# Patient Record
Sex: Male | Born: 1950 | Race: Black or African American | Hispanic: No | Marital: Married | Smoking: Current every day smoker
Health system: Southern US, Community
[De-identification: ages and names within clinical notes are randomized; demographics above are authoritative.]

## PROBLEM LIST (undated history)

## (undated) DIAGNOSIS — I10 Essential (primary) hypertension: Secondary | ICD-10-CM

## (undated) DIAGNOSIS — M1990 Unspecified osteoarthritis, unspecified site: Secondary | ICD-10-CM

## (undated) DIAGNOSIS — J449 Chronic obstructive pulmonary disease, unspecified: Secondary | ICD-10-CM

## (undated) HISTORY — PX: TONSILLECTOMY: SUR1361

---

## 2016-07-09 ENCOUNTER — Emergency Department (HOSPITAL_COMMUNITY): Payer: Non-veteran care

## 2016-07-09 ENCOUNTER — Emergency Department (HOSPITAL_COMMUNITY)
Admission: EM | Admit: 2016-07-09 | Discharge: 2016-07-09 | Disposition: A | Discharge status: Home / Self Care | Payer: Non-veteran care | Attending: Emergency Medicine | Admitting: Emergency Medicine

## 2016-07-09 ENCOUNTER — Encounter (HOSPITAL_COMMUNITY): Payer: Self-pay | Admitting: Vascular Surgery

## 2016-07-09 DIAGNOSIS — F17210 Nicotine dependence, cigarettes, uncomplicated: Secondary | ICD-10-CM | POA: Insufficient documentation

## 2016-07-09 DIAGNOSIS — I10 Essential (primary) hypertension: Secondary | ICD-10-CM | POA: Insufficient documentation

## 2016-07-09 DIAGNOSIS — J441 Chronic obstructive pulmonary disease with (acute) exacerbation: Secondary | ICD-10-CM | POA: Insufficient documentation

## 2016-07-09 DIAGNOSIS — R0602 Shortness of breath: Secondary | ICD-10-CM | POA: Diagnosis present

## 2016-07-09 HISTORY — DX: Unspecified osteoarthritis, unspecified site: M19.90

## 2016-07-09 HISTORY — DX: Essential (primary) hypertension: I10

## 2016-07-09 LAB — BASIC METABOLIC PANEL
Anion gap: 5 (ref 5–15)
BUN: 23 mg/dL — ABNORMAL HIGH (ref 6–20)
CO2: 28 mmol/L (ref 22–32)
Calcium: 9 mg/dL (ref 8.9–10.3)
Chloride: 101 mmol/L (ref 101–111)
Creatinine, Ser: 1.22 mg/dL (ref 0.61–1.24)
GFR calc Af Amer: >60 mL/min (ref >60)
GFR calc non Af Amer: >60 mL/min (ref >60)
Glucose, Bld: 115 mg/dL — ABNORMAL HIGH (ref 65–99)
Potassium: 3.8 mmol/L (ref 3.5–5.1)
Sodium: 134 mmol/L — ABNORMAL LOW (ref 135–145)

## 2016-07-09 LAB — CBC
HCT: 46.3 % (ref 39.0–52.0)
Hemoglobin: 15.9 g/dL (ref 13.0–17.0)
MCH: 34.8 pg — ABNORMAL HIGH (ref 26.0–34.0)
MCHC: 34.3 g/dL (ref 30.0–36.0)
MCV: 101.3 fL — ABNORMAL HIGH (ref 78.0–100.0)
Platelets: 173 10*3/uL (ref 150–400)
RBC: 4.57 MIL/uL (ref 4.22–5.81)
RDW: 14.5 % (ref 11.5–15.5)
WBC: 8.5 10*3/uL (ref 4.0–10.5)

## 2016-07-09 LAB — I-STAT TROPONIN, ED
TROPONIN I, POC: 0.02 ng/mL (ref 0.00–0.08)
TROPONIN I, POC: 0.02 ng/mL (ref 0.00–0.08)

## 2016-07-09 MED ORDER — ALBUTEROL SULFATE HFA 108 (90 BASE) MCG/ACT IN AERS
2.0000 | INHALATION_SPRAY | Freq: Once | RESPIRATORY_TRACT | Status: AC
  Administered 2016-07-09: 2 via RESPIRATORY_TRACT
  Filled 2016-07-09: qty 6.7

## 2016-07-09 MED ORDER — ALBUTEROL SULFATE HFA 108 (90 BASE) MCG/ACT IN AERS: 1.0000 | INHALATION_SPRAY | Freq: Four times a day (QID) | RESPIRATORY_TRACT | 0 refills | Status: AC | PRN

## 2016-07-09 MED ORDER — PREDNISONE 20 MG PO TABS
60.0000 mg | ORAL_TABLET | Freq: Once | ORAL | Status: AC
  Administered 2016-07-09: 60 mg via ORAL
  Filled 2016-07-09: qty 3

## 2016-07-09 MED ORDER — AZITHROMYCIN 500 MG PO TABS: 500.0000 mg | ORAL_TABLET | Freq: Every day | ORAL | 0 refills | Status: AC

## 2016-07-09 MED ORDER — AZITHROMYCIN 250 MG PO TABS
500.0000 mg | ORAL_TABLET | Freq: Once | ORAL | Status: AC
  Administered 2016-07-09: 500 mg via ORAL
  Filled 2016-07-09: qty 2

## 2016-07-09 MED ORDER — IPRATROPIUM-ALBUTEROL 0.5-2.5 (3) MG/3ML IN SOLN
3.0000 mL | Freq: Once | RESPIRATORY_TRACT | Status: AC
  Administered 2016-07-09: 3 mL via RESPIRATORY_TRACT
  Filled 2016-07-09: qty 3

## 2016-07-09 MED ORDER — PREDNISONE 20 MG PO TABS: 60.0000 mg | ORAL_TABLET | Freq: Every day | ORAL | 0 refills | Status: AC

## 2016-07-09 NOTE — ED Triage Notes (Signed)
Pt reports to the ED for eval of productive yellow cough and SOB x several months. SOB is constant. Pt reports the symptoms have stayed the same but they are not resolving so he decided to come in. Reports some intermittent CPs but he can take some Tums and usually that will help. Pt denies any CP at this time.

## 2016-07-09 NOTE — Discharge Instructions (Signed)
We believe that your symptoms are caused today by an exacerbation of your COPD, and possibly bronchitis.  Please take the prescribed medications and any medications that you have at home for your COPD.  Follow up with your doctor as recommended.  If you develop any new or worsening symptoms, including but not limited to fever, persistent vomiting, worsening shortness of breath, or other symptoms that concern you, please return to the Emergency Department immediately. ° ° °Chronic Obstructive Pulmonary Disease °Chronic obstructive pulmonary disease (COPD) is a common lung condition in which airflow from the lungs is limited. COPD is a general term that can be used to describe many different lung problems that limit airflow, including both chronic bronchitis and emphysema.  If you have COPD, your lung function will probably never return to normal, but there are measures you can take to improve lung function and make yourself feel better.  °CAUSES  °Smoking (common).   °Exposure to secondhand smoke.   °Genetic problems. °Chronic inflammatory lung diseases or recurrent infections. °SYMPTOMS  °Shortness of breath, especially with physical activity.   °Deep, persistent (chronic) cough with a large amount of thick mucus.   °Wheezing.   °Rapid breaths (tachypnea).   °Gray or bluish discoloration (cyanosis) of the skin, especially in fingers, toes, or lips.   °Fatigue.   °Weight loss.   °Frequent infections or episodes when breathing symptoms become much worse (exacerbations).   °Chest tightness. °DIAGNOSIS  °Your health care provider will take a medical history and perform a physical examination to make the initial diagnosis.  Additional tests for COPD may include:  °Lung (pulmonary) function tests. °Chest X-ray. °CT scan. °Blood tests. °TREATMENT  °Treatment available to help you feel better when you have COPD includes:  °Inhaler and nebulizer medicines. These help manage the symptoms of COPD and make your breathing more  comfortable. °Supplemental oxygen. Supplemental oxygen is only helpful if you have a low oxygen level in your blood.   °Exercise and physical activity. These are beneficial for nearly all people with COPD. Some people may also benefit from a pulmonary rehabilitation program. °HOME CARE INSTRUCTIONS  °Take all medicines (inhaled or pills) as directed by your health care provider. °Avoid over-the-counter medicines or cough syrups that dry up your airway (such as antihistamines) and slow down the elimination of secretions unless instructed otherwise by your health care provider.   °If you are a smoker, the most important thing that you can do is stop smoking. Continuing to smoke will cause further lung damage and breathing trouble. Ask your health care provider for help with quitting smoking. He or she can direct you to community resources or hospitals that provide support. °Avoid exposure to irritants such as smoke, chemicals, and fumes that aggravate your breathing. °Use oxygen therapy and pulmonary rehabilitation if directed by your health care provider. If you require home oxygen therapy, ask your health care provider whether you should purchase a pulse oximeter to measure your oxygen level at home.   °Avoid contact with individuals who have a contagious illness. °Avoid extreme temperature and humidity changes. °Eat healthy foods. Eating smaller, more frequent meals and resting before meals may help you maintain your strength. °Stay active, but balance activity with periods of rest. Exercise and physical activity will help you maintain your ability to do things you want to do. °Preventing infection and hospitalization is very important when you have COPD. Make sure to receive all the vaccines your health care provider recommends, especially the pneumococcal and influenza   vaccines. Ask your health care provider whether you need a pneumonia vaccine. °Learn and use relaxation techniques to manage stress. °Learn and  use controlled breathing techniques as directed by your health care provider. Controlled breathing techniques include:   °Pursed lip breathing. Start by breathing in (inhaling) through your nose for 1 second. Then, purse your lips as if you were going to whistle and breathe out (exhale) through the pursed lips for 2 seconds.   °Diaphragmatic breathing. Start by putting one hand on your abdomen just above your waist. Inhale slowly through your nose. The hand on your abdomen should move out. Then purse your lips and exhale slowly. You should be able to feel the hand on your abdomen moving in as you exhale.   °Learn and use controlled coughing to clear mucus from your lungs. Controlled coughing is a series of short, progressive coughs. The steps of controlled coughing are:   °Lean your head slightly forward.   °Breathe in deeply using diaphragmatic breathing.   °Try to hold your breath for 3 seconds.   °Keep your mouth slightly open while coughing twice.   °Spit any mucus out into a tissue.   °Rest and repeat the steps once or twice as needed. °SEEK MEDICAL CARE IF:  °You are coughing up more mucus than usual.   °There is a change in the color or thickness of your mucus.   °Your breathing is more labored than usual.   °Your breathing is faster than usual.   °SEEK IMMEDIATE MEDICAL CARE IF:  °You have shortness of breath while you are resting.   °You have shortness of breath that prevents you from: °Being able to talk.   °Performing your usual physical activities.   °You have chest pain lasting longer than 5 minutes.   °Your skin color is more cyanotic than usual. °You measure low oxygen saturations for longer than 5 minutes with a pulse oximeter. °MAKE SURE YOU:  °Understand these instructions. °Will watch your condition. °Will get help right away if you are not doing well or get worse. °Document Released: 08/01/2005 Document Revised: 03/08/2014 Document Reviewed: 06/18/2013 °[employer]® Patient Information ©2015  [employer]. This information is not intended to replace advice given to you by your health care provider. Make sure you discuss any questions you have with your health care provider. ° °

## 2016-07-09 NOTE — ED Provider Notes (Signed)
Emergency Department Provider Note   I have reviewed the triage vital signs and the nursing notes.   HISTORY  Chief Complaint Shortness of Breath   HPI Warren Ortiz is a 65 y.o. male with PMH of COPD and HTN presents to the ED for evaluation of acute on chronic difficulty breathing. Patient states he has a long history of smoking and known COPD. He reports receiving albuterol inhaler approximately one year ago but is unsure if it helped. Patient states she's unwilling to take steroids because of weight gain. He states over the past several months his breathing is become progressively worse with sudden worsening over the last week. He treats it partially to weight gain. Patient also states he continues to smoke cigarettes regularly. He has had a productive cough but no fevers or shaking chills. He endorses intermittent chest discomfort in the center of his chest that is nonradiating. States he has had some of this chest discomfort today.  Past Medical History:  Diagnosis Date  . Arthritis   . Hypertension     There are no active problems to display for this patient.   Past Surgical History:  Procedure Laterality Date  . TONSILLECTOMY        Allergies Review of patient's allergies indicates not on file.  No family history on file.  Social History Social History  Substance Use Topics  . Smoking status: Current Every Day Smoker    Packs/day: 1.00    Types: Cigarettes  . Smokeless tobacco: Never Used  . Alcohol use Yes     Comment: occasionally    Review of Systems  Constitutional: No fever/chills Eyes: No visual changes. ENT: No sore throat. Cardiovascular: Positive chest pain. Respiratory: Positive shortness of breath. Gastrointestinal: No abdominal pain.  No nausea, no vomiting.  No diarrhea.  No constipation. Genitourinary: Negative for dysuria. Musculoskeletal: Negative for back pain. Skin: Negative for rash. Neurological: Negative for headaches, focal  weakness or numbness.  10-point ROS otherwise negative.  ____________________________________________   PHYSICAL EXAM:  VITAL SIGNS: ED Triage Vitals [07/09/16 1902]  Enc Vitals Group     BP 183/86     Pulse Rate 64     Resp 16     Temp 98 F (36.7 C)     Temp Source Oral     SpO2 94 %   Constitutional: Alert and oriented. Well appearing and in no acute distress. Eyes: Conjunctivae are normal.  Head: Atraumatic. Nose: No congestion/rhinnorhea. Mouth/Throat: Mucous membranes are moist.  Oropharynx non-erythematous. Neck: No stridor.  Cardiovascular: Normal rate, regular rhythm. Good peripheral circulation. Grossly normal heart sounds.   Respiratory: Slight increased respiratory effort. End expiratory course wheezing throughout all lung fields.  Gastrointestinal: Soft and nontender. No distention.  Musculoskeletal: No lower extremity tenderness nor edema. No gross deformities of extremities. Neurologic:  Normal speech and language. No gross focal neurologic deficits are appreciated.  Skin:  Skin is warm, dry and intact. No rash noted. Psychiatric: Mood and affect are normal. Speech and behavior are normal.  ____________________________________________   LABS (all labs ordered are listed, but only abnormal results are displayed)  Labs Reviewed  BASIC METABOLIC PANEL - Abnormal; Notable for the following:       Result Value   Sodium 134 (*)    Glucose, Bld 115 (*)    BUN 23 (*)    All other components within normal limits  CBC - Abnormal; Notable for the following:    MCV 101.3 (*)    [facility]  34.8 (*)    All other components within normal limits  I-STAT TROPOININ, ED  I-STAT TROPOININ, ED   ____________________________________________  EKG   EKG Interpretation  Date/Time:  Monday July 09 2016 18:59:09 EDT Ventricular Rate:  67 PR Interval:  224 QRS Duration: 104 QT Interval:  418 QTC Calculation: 441 R Axis:   50 Text Interpretation:  Sinus rhythm with  1st degree A-V block Incomplete right bundle branch block Borderline ECG No STEMI. No prior for comparison.  Confirmed by LONG MD, JOSHUA [phone]) on 07/09/2016 10:35:40 PM       ____________________________________________  RADIOLOGY  Dg Chest 2 View  Result Date: 07/09/2016 CLINICAL DATA:  Chest pain and shortness of breath. EXAM: CHEST  2 VIEW COMPARISON:  None. FINDINGS: Lung volumes are low with basilar atelectasis. Peribronchial thickening is identified. No pneumothorax or pleural effusion. Heart size is normal. Aortic atherosclerosis is noted. No focal bony abnormality. IMPRESSION: Bronchitic change.  No focal process. Atherosclerosis. Electronically Signed   By: Drusilla Kanner M.D.   On: 07/09/2016 19:44    ____________________________________________   PROCEDURES  Procedure(s) performed:   Procedures  None ____________________________________________   INITIAL IMPRESSION / ASSESSMENT AND PLAN / ED COURSE  Pertinent labs & imaging results that were available during my care of the patient were reviewed by me and considered in my medical decision making (see chart for details).  Patient resents the emergency department for evaluation of acute on chronic difficulty breathing and intermittent chest discomfort. Patient in no acute distress. He does have end expiratory wheezing on exam. Some chest discomfort today but not more than normal. Plan for DuoNeb and delta troponin. Patient states she's unwilling to take steroids because of weight gain. Chest x-ray reviewed with no evidence of infiltrate.  10:47 PM Had a long discussion with the patient about smoking cessation. He is currently in the pre-contemplative stage. He is concerned about weight gain if he stops. He did consent to receiving steroids for short period of time for likely COPD exacerbation. We'll give antibiotics along with this. He feels somewhat improved after albuterol neb. Second troponin being drawn now.  Anticipate discharge with mild COPD exacerbation if troponin is negative.  11:29 PM Second troponin negative. Plan for discharge with plan as above.   At this time, I do not feel there is any life-threatening condition present. I have reviewed and discussed all results (EKG, imaging, lab, urine as appropriate), exam findings with patient. I have reviewed nursing notes and appropriate previous records.  I feel the patient is safe to be discharged home without further emergent workup. Discussed usual and customary return precautions. Patient and family (if present) verbalize understanding and are comfortable with this plan.  Patient will follow-up with their primary care provider. If they do not have a primary care provider, information for follow-up has been provided to them. All questions have been answered.  ____________________________________________  FINAL CLINICAL IMPRESSION(S) / ED DIAGNOSES  Final diagnoses:  COPD exacerbation (HCC)     MEDICATIONS GIVEN DURING THIS VISIT:  Medications  ipratropium-albuterol (DUONEB) 0.5-2.5 (3) MG/3ML nebulizer solution 3 mL (3 mLs Nebulization Given 07/09/16 2153)  predniSONE (DELTASONE) tablet 60 mg (60 mg Oral Given 07/09/16 2309)  azithromycin (ZITHROMAX) tablet 500 mg (500 mg Oral Given 07/09/16 2310)  albuterol (PROVENTIL HFA;VENTOLIN HFA) 108 (90 Base) MCG/ACT inhaler 2 puff (2 puffs Inhalation Given 07/09/16 2348)     NEW OUTPATIENT MEDICATIONS STARTED DURING THIS VISIT:  Discharge Medication List as of 07/09/2016 11:32 PM  START taking these medications   Details  albuterol (PROVENTIL HFA;VENTOLIN HFA) 108 (90 Base) MCG/ACT inhaler Inhale 1-2 puffs into the lungs every 6 (six) hours as needed for wheezing or shortness of breath., Starting Mon 07/09/2016, Print    azithromycin (ZITHROMAX) 500 MG tablet Take 1 tablet (500 mg total) by mouth daily. Take first 2 tablets together, then 1 every day until finished., Starting Mon 07/09/2016, Until Sat  07/14/2016, Print    predniSONE (DELTASONE) 20 MG tablet Take 3 tablets (60 mg total) by mouth daily., Starting Tue 07/10/2016, Until Sat 07/14/2016, Print          Note:  This document was prepared using Dragon voice recognition software and may include unintentional dictation errors.  Alona Bene, MD Emergency Medicine   Maia Plan, MD 07/10/16 [phone]

## 2016-07-09 NOTE — ED Notes (Signed)
Pt was very rude at discharge; Pt states that [facility] not [facility]; Pt advised he can fill RX at any pharmacy; Pt states he is broke and have any money; Pt advised he may have to call [facility] to find out how to get RX; pt advised of how important RX is for health concern; Pt ws uninterested in hearing discharge information; Discharge info was handed to pt;

## 2016-07-28 ENCOUNTER — Inpatient Hospital Stay (HOSPITAL_COMMUNITY)
Admission: EM | Admit: 2016-07-28 | Discharge: 2016-07-31 | DRG: 190 | Discharge status: Against Medical Advice | Payer: Non-veteran care | Attending: Internal Medicine | Admitting: Internal Medicine

## 2016-07-28 ENCOUNTER — Encounter (HOSPITAL_COMMUNITY): Payer: Self-pay | Admitting: Emergency Medicine

## 2016-07-28 ENCOUNTER — Emergency Department (HOSPITAL_COMMUNITY): Payer: Non-veteran care

## 2016-07-28 DIAGNOSIS — F1190 Opioid use, unspecified, uncomplicated: Secondary | ICD-10-CM | POA: Diagnosis present

## 2016-07-28 DIAGNOSIS — R600 Localized edema: Secondary | ICD-10-CM | POA: Diagnosis present

## 2016-07-28 DIAGNOSIS — D72829 Elevated white blood cell count, unspecified: Secondary | ICD-10-CM

## 2016-07-28 DIAGNOSIS — G4733 Obstructive sleep apnea (adult) (pediatric): Secondary | ICD-10-CM | POA: Diagnosis present

## 2016-07-28 DIAGNOSIS — F1120 Opioid dependence, uncomplicated: Secondary | ICD-10-CM | POA: Diagnosis present

## 2016-07-28 DIAGNOSIS — I440 Atrioventricular block, first degree: Secondary | ICD-10-CM | POA: Diagnosis present

## 2016-07-28 DIAGNOSIS — I10 Essential (primary) hypertension: Secondary | ICD-10-CM | POA: Diagnosis not present

## 2016-07-28 DIAGNOSIS — J9811 Atelectasis: Secondary | ICD-10-CM | POA: Diagnosis present

## 2016-07-28 DIAGNOSIS — R0600 Dyspnea, unspecified: Secondary | ICD-10-CM | POA: Diagnosis not present

## 2016-07-28 DIAGNOSIS — F17210 Nicotine dependence, cigarettes, uncomplicated: Secondary | ICD-10-CM | POA: Diagnosis present

## 2016-07-28 DIAGNOSIS — F10239 Alcohol dependence with withdrawal, unspecified: Secondary | ICD-10-CM | POA: Diagnosis present

## 2016-07-28 DIAGNOSIS — J9602 Acute respiratory failure with hypercapnia: Secondary | ICD-10-CM | POA: Diagnosis present

## 2016-07-28 DIAGNOSIS — I5030 Unspecified diastolic (congestive) heart failure: Secondary | ICD-10-CM | POA: Diagnosis present

## 2016-07-28 DIAGNOSIS — Z833 Family history of diabetes mellitus: Secondary | ICD-10-CM

## 2016-07-28 DIAGNOSIS — J441 Chronic obstructive pulmonary disease with (acute) exacerbation: Secondary | ICD-10-CM | POA: Diagnosis present

## 2016-07-28 DIAGNOSIS — T380X5A Adverse effect of glucocorticoids and synthetic analogues, initial encounter: Secondary | ICD-10-CM | POA: Diagnosis present

## 2016-07-28 DIAGNOSIS — R7989 Other specified abnormal findings of blood chemistry: Secondary | ICD-10-CM | POA: Diagnosis present

## 2016-07-28 DIAGNOSIS — Z6841 Body Mass Index (BMI) 40.0 and over, adult: Secondary | ICD-10-CM | POA: Diagnosis not present

## 2016-07-28 DIAGNOSIS — F10939 Alcohol use, unspecified with withdrawal, unspecified: Secondary | ICD-10-CM | POA: Diagnosis present

## 2016-07-28 DIAGNOSIS — R001 Bradycardia, unspecified: Secondary | ICD-10-CM | POA: Diagnosis present

## 2016-07-28 DIAGNOSIS — R0602 Shortness of breath: Secondary | ICD-10-CM | POA: Diagnosis present

## 2016-07-28 DIAGNOSIS — I50810 Right heart failure, unspecified: Secondary | ICD-10-CM

## 2016-07-28 DIAGNOSIS — Z720 Tobacco use: Secondary | ICD-10-CM | POA: Diagnosis present

## 2016-07-28 DIAGNOSIS — I110 Hypertensive heart disease with heart failure: Secondary | ICD-10-CM | POA: Diagnosis present

## 2016-07-28 DIAGNOSIS — J449 Chronic obstructive pulmonary disease, unspecified: Secondary | ICD-10-CM

## 2016-07-28 DIAGNOSIS — N179 Acute kidney failure, unspecified: Secondary | ICD-10-CM | POA: Diagnosis present

## 2016-07-28 HISTORY — DX: Chronic obstructive pulmonary disease, unspecified: J44.9

## 2016-07-28 LAB — CBC WITH DIFFERENTIAL/PLATELET
BASOS ABS: 0 10*3/uL (ref 0.0–0.1)
BASOS PCT: 0 %
Eosinophils Absolute: 0.1 10*3/uL (ref 0.0–0.7)
Eosinophils Relative: 1 %
HEMATOCRIT: 45.4 % (ref 39.0–52.0)
HEMOGLOBIN: 15.5 g/dL (ref 13.0–17.0)
Lymphocytes Relative: 19 %
Lymphs Abs: 1.4 10*3/uL (ref 0.7–4.0)
MCH: 35.1 pg — ABNORMAL HIGH (ref 26.0–34.0)
MCHC: 34.1 g/dL (ref 30.0–36.0)
MCV: 102.7 fL — ABNORMAL HIGH (ref 78.0–100.0)
MONOS PCT: 8 %
Monocytes Absolute: 0.6 10*3/uL (ref 0.1–1.0)
NEUTROS ABS: 5.2 10*3/uL (ref 1.7–7.7)
NEUTROS PCT: 72 %
Platelets: 153 10*3/uL (ref 150–400)
RBC: 4.42 MIL/uL (ref 4.22–5.81)
RDW: 13.8 % (ref 11.5–15.5)
WBC: 7.2 10*3/uL (ref 4.0–10.5)

## 2016-07-28 LAB — I-STAT ARTERIAL BLOOD GAS, ED
Acid-Base Excess: 5 mmol/L — ABNORMAL HIGH (ref 0.0–2.0)
Acid-base deficit: 1 mmol/L (ref 0.0–2.0)
Bicarbonate: 27.1 mmol/L (ref 20.0–28.0)
Bicarbonate: 29.6 mmol/L — ABNORMAL HIGH (ref 20.0–28.0)
O2 Saturation: 95 %
O2 Saturation: 99 %
PH ART: 7.287 — AB (ref 7.350–7.450)
TCO2: 29 mmol/L (ref 0–100)
TCO2: 31 mmol/L (ref 0–100)
pCO2 arterial: 56.6 mmHg — ABNORMAL HIGH (ref 32.0–48.0)
pCO2 arterial: 43.8 mmHg (ref 32.0–48.0)
pH, Arterial: 7.438 (ref 7.350–7.450)
pO2, Arterial: 85 mmHg (ref 83.0–108.0)
pO2, Arterial: 145 mmHg — ABNORMAL HIGH (ref 83.0–108.0)

## 2016-07-28 LAB — COMPREHENSIVE METABOLIC PANEL
ALBUMIN: 2.5 g/dL — AB (ref 3.5–5.0)
ALK PHOS: 37 U/L — AB (ref 38–126)
ALT: 71 U/L — AB (ref 17–63)
AST: 62 U/L — AB (ref 15–41)
Anion gap: 8 (ref 5–15)
BILIRUBIN TOTAL: 0.7 mg/dL (ref 0.3–1.2)
BUN: 19 mg/dL (ref 6–20)
CALCIUM: 8.5 mg/dL — AB (ref 8.9–10.3)
CO2: 26 mmol/L (ref 22–32)
CREATININE: 1.29 mg/dL — AB (ref 0.61–1.24)
Chloride: 104 mmol/L (ref 101–111)
GFR calc Af Amer: >60 mL/min (ref >60)
GFR calc non Af Amer: 57 mL/min — ABNORMAL LOW (ref >60)
GLUCOSE: 109 mg/dL — AB (ref 65–99)
Potassium: 4.1 mmol/L (ref 3.5–5.1)
Sodium: 138 mmol/L (ref 135–145)
TOTAL PROTEIN: 6.2 g/dL — AB (ref 6.5–8.1)

## 2016-07-28 LAB — I-STAT CHEM 8, ED
BUN: 23 mg/dL — AB (ref 6–20)
CHLORIDE: 103 mmol/L (ref 101–111)
Calcium, Ion: 1 mmol/L — ABNORMAL LOW (ref 1.15–1.40)
Creatinine, Ser: 1.3 mg/dL — ABNORMAL HIGH (ref 0.61–1.24)
Glucose, Bld: 110 mg/dL — ABNORMAL HIGH (ref 65–99)
HEMATOCRIT: 47 % (ref 39.0–52.0)
Hemoglobin: 16 g/dL (ref 13.0–17.0)
Potassium: 3.9 mmol/L (ref 3.5–5.1)
SODIUM: 138 mmol/L (ref 135–145)
TCO2: 26 mmol/L (ref 0–100)

## 2016-07-28 LAB — CBG MONITORING, ED: Glucose-Capillary: 125 mg/dL — ABNORMAL HIGH (ref 65–99)

## 2016-07-28 LAB — BRAIN NATRIURETIC PEPTIDE: B NATRIURETIC PEPTIDE 5: 59.3 pg/mL (ref 0.0–100.0)

## 2016-07-28 LAB — TROPONIN I
Troponin I: 0.03 ng/mL (ref <0.03)
Troponin I: 0.03 ng/mL (ref <0.03)

## 2016-07-28 LAB — I-STAT TROPONIN, ED: Troponin i, poc: 0.04 ng/mL (ref 0.00–0.08)

## 2016-07-28 LAB — MRSA PCR SCREENING: MRSA by PCR: NEGATIVE

## 2016-07-28 MED ORDER — ONDANSETRON HCL 4 MG PO TABS: 4.0000 mg | ORAL_TABLET | Freq: Four times a day (QID) | ORAL | Status: DC | PRN

## 2016-07-28 MED ORDER — METHADONE HCL 10 MG PO TABS
80.0000 mg | ORAL_TABLET | Freq: Every day | ORAL | Status: DC
  Administered 2016-07-29 – 2016-07-31 (×3): 80 mg via ORAL
  Filled 2016-07-28 (×3): qty 8

## 2016-07-28 MED ORDER — HYDRALAZINE HCL 20 MG/ML IJ SOLN
10.0000 mg | Freq: Three times a day (TID) | INTRAMUSCULAR | Status: DC | PRN
Start: 2016-07-28 — End: 2016-08-01
  Administered 2016-07-28 – 2016-07-31 (×7): 10 mg via INTRAVENOUS
  Filled 2016-07-28 (×7): qty 1

## 2016-07-28 MED ORDER — ENOXAPARIN SODIUM 40 MG/0.4ML ~~LOC~~ SOLN
40.0000 mg | SUBCUTANEOUS | Status: DC
  Administered 2016-07-28 – 2016-07-31 (×4): 40 mg via SUBCUTANEOUS
  Filled 2016-07-28 (×4): qty 0.4

## 2016-07-28 MED ORDER — IPRATROPIUM-ALBUTEROL 0.5-2.5 (3) MG/3ML IN SOLN: 3.0000 mL | Freq: Once | RESPIRATORY_TRACT | Status: DC

## 2016-07-28 MED ORDER — BISACODYL 5 MG PO TBEC: 5.0000 mg | DELAYED_RELEASE_TABLET | Freq: Every day | ORAL | Status: DC | PRN

## 2016-07-28 MED ORDER — ASPIRIN 81 MG PO CHEW: 324.0000 mg | CHEWABLE_TABLET | Freq: Once | ORAL | Status: DC

## 2016-07-28 MED ORDER — FENTANYL CITRATE (PF) 100 MCG/2ML IJ SOLN
50.0000 ug | Freq: Once | INTRAMUSCULAR | Status: AC
  Administered 2016-07-28: 50 ug via INTRAVENOUS
  Filled 2016-07-28: qty 2

## 2016-07-28 MED ORDER — NALOXONE HCL 0.4 MG/ML IJ SOLN
0.4000 mg | Freq: Once | INTRAMUSCULAR | Status: AC
  Administered 2016-07-28: 0.4 mg via INTRAVENOUS
  Filled 2016-07-28: qty 1

## 2016-07-28 MED ORDER — ACETAMINOPHEN 325 MG PO TABS: 650.0000 mg | ORAL_TABLET | Freq: Four times a day (QID) | ORAL | Status: DC | PRN

## 2016-07-28 MED ORDER — ACETAMINOPHEN 650 MG RE SUPP: 650.0000 mg | Freq: Four times a day (QID) | RECTAL | Status: DC | PRN

## 2016-07-28 MED ORDER — LEVOFLOXACIN IN D5W 750 MG/150ML IV SOLN
750.0000 mg | INTRAVENOUS | Status: DC
  Administered 2016-07-28 – 2016-07-29 (×2): 750 mg via INTRAVENOUS
  Filled 2016-07-28 (×3): qty 150

## 2016-07-28 MED ORDER — METHADONE HCL 10 MG/ML PO CONC: 80.0000 mg | Freq: Every day | ORAL | Status: DC

## 2016-07-28 MED ORDER — GUAIFENESIN ER 600 MG PO TB12
600.0000 mg | ORAL_TABLET | Freq: Two times a day (BID) | ORAL | Status: DC
  Administered 2016-07-28 (×2): 600 mg via ORAL
  Filled 2016-07-28 (×2): qty 1

## 2016-07-28 MED ORDER — FUROSEMIDE 20 MG PO TABS: 20.0000 mg | ORAL_TABLET | Freq: Every day | ORAL | Status: DC

## 2016-07-28 MED ORDER — FUROSEMIDE 10 MG/ML IJ SOLN
40.0000 mg | Freq: Once | INTRAMUSCULAR | Status: AC
  Administered 2016-07-28: 40 mg via INTRAVENOUS
  Filled 2016-07-28: qty 4

## 2016-07-28 MED ORDER — POLYETHYLENE GLYCOL 3350 17 G PO PACK: 17.0000 g | PACK | Freq: Every day | ORAL | Status: DC | PRN

## 2016-07-28 MED ORDER — ALBUTEROL SULFATE (2.5 MG/3ML) 0.083% IN NEBU: 2.5000 mg | INHALATION_SOLUTION | RESPIRATORY_TRACT | Status: DC | PRN

## 2016-07-28 MED ORDER — METHYLPREDNISOLONE SODIUM SUCC 125 MG IJ SOLR
125.0000 mg | Freq: Once | INTRAMUSCULAR | Status: AC
  Administered 2016-07-28: 125 mg via INTRAVENOUS
  Filled 2016-07-28: qty 2

## 2016-07-28 MED ORDER — ONDANSETRON HCL 4 MG/2ML IJ SOLN: 4.0000 mg | Freq: Four times a day (QID) | INTRAMUSCULAR | Status: DC | PRN

## 2016-07-28 MED ORDER — LORAZEPAM 2 MG/ML IJ SOLN
1.0000 mg | Freq: Once | INTRAMUSCULAR | Status: AC
  Administered 2016-07-28: 1 mg via INTRAVENOUS
  Filled 2016-07-28: qty 1

## 2016-07-28 MED ORDER — NITROGLYCERIN 0.4 MG SL SUBL: 0.4000 mg | SUBLINGUAL_TABLET | SUBLINGUAL | Status: DC | PRN

## 2016-07-28 MED ORDER — SODIUM CHLORIDE 0.9% FLUSH
3.0000 mL | Freq: Two times a day (BID) | INTRAVENOUS | Status: DC
  Administered 2016-07-28 – 2016-07-31 (×6): 3 mL via INTRAVENOUS

## 2016-07-28 MED ORDER — IPRATROPIUM-ALBUTEROL 0.5-2.5 (3) MG/3ML IN SOLN
3.0000 mL | RESPIRATORY_TRACT | Status: DC
  Administered 2016-07-28 (×2): 3 mL via RESPIRATORY_TRACT
  Filled 2016-07-28 (×2): qty 3

## 2016-07-28 MED ORDER — FENTANYL CITRATE (PF) 100 MCG/2ML IJ SOLN
100.0000 ug | Freq: Once | INTRAMUSCULAR | Status: DC
  Filled 2016-07-28: qty 2

## 2016-07-28 MED ORDER — METHYLPREDNISOLONE SODIUM SUCC 125 MG IJ SOLR
60.0000 mg | Freq: Three times a day (TID) | INTRAMUSCULAR | Status: DC
  Administered 2016-07-29 (×3): 60 mg via INTRAVENOUS
  Filled 2016-07-28 (×3): qty 2

## 2016-07-28 NOTE — ED Triage Notes (Signed)
Pt arrives via [facility] reporting SOB on-going for weeks.  Pt reports cough with yellow sputum, denies fever or chills. Pt reports new L sided groin pain.  Pt lethargic.  CBG 125.

## 2016-07-28 NOTE — Progress Notes (Signed)
Latest troponin level-0.03, relayed to MD thru text page..Marland Kitchen

## 2016-07-28 NOTE — ED Notes (Signed)
Pt became very vocal, refused BiPap mask, repeated calling out.  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  Dr. Manus Gunningancour made aware.

## 2016-07-28 NOTE — H&P (Signed)
History and Physical    Warren Ortiz YQM:[id number] DOB: 03/06/1951 DOA: 07/28/2016  PCP: No PCP Per Patient  Followed at [facility]  Patient coming from:   Home  Chief Complaint:   Shortness of breath and cough  HPI: Warren Ortiz is a 65 y.o. male with a medical history significant for hypertension. Patient has a long history of smoking, apparently he carries a diagnosis of COPD. He was in the emergency department 07/09/16 for difficulty breathing, presumably for COPD exacerbation. Patient received prescriptions for prednisone, Zithromax, and albuterol inhaler. Patient states he completed the 5 days of prednisone and Zithromax and did feel that symptoms improved. Several days later however patient began having recurrent shortness of breath and a cough productive of yellow phlegm. Patient brought back to ED this morning by EMS. He was somnolent on arrival No fevers at home. No chest pain. Patient is urinating, he tries to watch his sodium intake closely. Patient complains of bilateral lower extremity swelling which started about one year ago. [facility]. No orthopnea. Patient does not want steroids as they cause weight gain. He has successfully lost weight through exercise and diet .   ED Course:  Afebrile, bradycardic in low fifties, respirations 13-19, BP elevated at 178/91. O2 saturation 95% on nasal cannula Creatinine 1.3, ionized calcium 1.0, BNP 59, POC troponin 0.04 WBC 7.2, hemoglobin 16 cxr-  atelectasis and chronic bronchitic change Blood gases- arterial pH 7.287, PCO2 56, PO2 85, bicarbonate 27 ECG Sinus rhythm Prolonged PR interval Probable lateral infarct, age indeterminate V3 ST elevation, no reciprocal change, dw Dr> Excell Seltzer Confirmed by [place] MD, STEPHEN [phone]) on 07/28/2016 10:17:29 AM  Patient was placed on BiPAP in the ED but pulled it off after becoming agitated following dose of narcan. 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 O2 saturations have been low nineties on nasal cannula except when patient becomes agitated. Repeat ABGs in progress now   Review of Systems: As per HPI, otherwise 10 point review of systems negative.    Past Medical History:  Diagnosis Date  . Arthritis   . COPD (chronic obstructive pulmonary disease) (HCC) 07/28/2016   per patient  . Hypertension     Past Surgical History:  Procedure Laterality Date  . TONSILLECTOMY      Social History   Social History  . Marital status: Married    Spouse name: N/A  . Number of children: N/A  . Years of education: N/A   Occupational History  . Not on file.   Social History Main Topics  . Smoking status: Current Every Day Smoker    Packs/day: 1.00    Types: Cigarettes  . Smokeless tobacco: Never Used  . Alcohol use Yes     Comment: occasionally  . Drug use:     Types: Marijuana     Comment: pt reports former heroin use  . Sexual activity: Not on file   Other Topics Concern  . Not on file   Social History Narrative  . No narrative on file   Patient lives at home with daughter. No assistive devices needed for ambulation. He smokes a pack of cigarettes daily. Patient has not had any alcohol in at least 3 weeks. He has a history of heroin addiction, on methadone for the last 20 years Not on File  Family History  Problem Relation Age of Onset  . Cancer Mother  patient thinks bladder or colon  . Diabetes Sister     Prior to Admission medications   Medication Sig Start Date End Date Taking? Authorizing Provider  albuterol (PROVENTIL HFA;VENTOLIN HFA) 108 (90 Base) MCG/ACT inhaler Inhale 1-2 puffs into the lungs every 6 (six) hours as needed for wheezing or shortness of breath. 07/09/16   Maia PlanJoshua G Long, MD  azithromycin (ZITHROMAX) 250 MG tablet Take 250 mg by mouth daily. 07/11/16   Historical Provider, MD    Physical Exam: Vitals:   07/28/16 0934 07/28/16 0945 07/28/16 1015  07/28/16 1045  BP:  (!) 199/104  (!) 208/98  Pulse:  (!) 55 (!) 48 75  Resp:  14 13 19   Temp:      TempSrc:      SpO2: 96% 97% 98% 98%  Weight:      Height:        Constitutional:  Pleasant obese black male in NAD, calm Vitals:   07/28/16 0934 07/28/16 0945 07/28/16 1015 07/28/16 1045  BP:  (!) 199/104  (!) 208/98  Pulse:  (!) 55 (!) 48 75  Resp:  14 13 19   Temp:      TempSrc:      SpO2: 96% 97% 98% 98%  Weight:      Height:       Eyes: PER, lids and conjunctivae normal ENMT: Mucous membranes are moist. Posterior pharynx clear of any exudate or lesions..  Neck: normal, supple, no masses Respiratory: Decreased breath sounds at both bases but no wheezing heard. Normal respiratory effort on nasal cannula. No accessory muscle use.  Cardiovascular: Regular rate and rhythm, no murmurs / rubs / gallops. Pitting edema of bilateral lower extremities up to the knees .  1+ dorsal pedis pulses.   Abdomen: Soft, obese, no tenderness, no masses palpated.  Bowel sounds positive.  Musculoskeletal: no clubbing / cyanosis. No joint deformity upper and lower extremities. Good ROM, no contractures. Normal muscle tone.  Skin: no rashes. Lateral aspect of fifth toe with small, round wound which is nondraining and partially scabbed over Neurologic: CN 2-12 grossly intact. Sensation intact, Strength 5/5 in all 4.  Psychiatric: Normal judgment and insight. Alert and oriented x 3. Normal mood.   Labs on Admission: I have personally reviewed following labs and imaging studies   Radiological Exams on Admission: Dg Chest Portable 1 View  Result Date: 07/28/2016 CLINICAL DATA:  Pt c/o SOB for 2+ weeks along with a productive cough with yellow sputum. Hx HTN, Current smoker-- 1ppd. EXAM: PORTABLE CHEST 1 VIEW COMPARISON:  07/09/2016 FINDINGS: Lung volumes are relatively low. There is lung base opacity consistent with a combination of atelectasis and chronic bronchitic change. No convincing pneumonia. No  pulmonary edema. No pleural effusion or pneumothorax. Cardiac silhouette is normal in size. No mediastinal or hilar masses or evidence of adenopathy. Bony thorax is grossly intact. IMPRESSION: 1. No evidence of pneumonia or pulmonary edema. 2. Lung base opacity is similar to the prior exam allowing for lower lung volumes. This is likely combination of atelectasis and chronic bronchitic change. Electronically Signed   By: Amie Portlandavid  Ormond M.D.   On: 07/28/2016 11:15    EKG: Independently reviewed.   EKG Interpretation  Date/Time:  Saturday July 28 2016 09:39:22 EDT Ventricular Rate:  57 PR Interval:    QRS Duration: 98 QT Interval:  471 QTC Calculation: 459 R Axis:   129 Text Interpretation:  Sinus rhythm Prolonged PR interval Probable lateral infarct, age indeterminate V3 ST elevation, no  reciprocal change, dw Dr> Lytle Michaels by Manus Gunning  MD, STEPHEN [phone]) on 07/28/2016 10:17:29 AM      Assessment/Plan   Active Problems:   Acute hypercapnic respiratory failure (HCC)   Hypertension   COPD exacerbation (HCC)   Tobacco abuse   Methadone use (HCC)   AKI (acute kidney injury) (HCC)      Acute hypercapnic respiratory failure. He gives a history of COPD. Treated in ED for COPD exacerbation 3 weeks ago. Completed 5 days of Zithromax and Prednisone. Today patient brought in somnolent, surprisingly C02 only 56.  Suspect COPD exacerbation but may have component of heart failure as well (no edema on chest x-ray  and BNP normal but he does have massive pitting edema of his lower extremities).  Refusing further BiPAP, O2 saturation low nineties on nasal cannula (unless patient becomes agitated) -Admit to -Order set for acute respiratory failure utilized  -Patient adamant about not receiving steroids because of weight gain. After long discussion he does agree to Solu-Medrol but strongly requests SHORT prednisone taper at discharge. Received Solumedrol 125mg  in ED, tomorrow will begin 60mg   TID -IV levaquin, Duonebs, prn albuterol -Repeat ABGs just obtained. Continue 02 per [place] for now. Patient came in somnolent and continues to frequently drift off to sleep during interview. Needs continuous pulse oximetry. He could possibly need Bipap again. -Trial of lasix ( used to take at home)  Peripheral edema, ? Heart failure.  -echocardiogram -dose of IV lasix x1 today -daily wt., monitor intake and output  EKG changes. Probable ST elevation in V3. EDP reviewed with Dr. Excell Seltzer. No chest pain -trend troponins -cardiac monitoring -echocardiogram  Methadone use-  heroin addiction. No recent medication changes at home. Will continue home Methadone.    OSA, couldn't tolerate cpap.    HTN. BP elevated in ED. Agitated earlier after Narcan. BP better but still elevated at 178 / 91 -hydralazine prn  Chronic methadone use. Given narcan in ED which led to improved alertness but this was followed by agitation // anger about the reversal.   Mild AKI, renal function was normal 07/09/16. -minimize nephrotoxic medications  -am bmet  Tobacco use.  Strongly recommended patient give thought to Nicoderm patch. Explained that continuation of tobacco will only lead to further COPD exacerbations requiring additional steroids which patient adamantly opposes.  Nicotine is also a risk factor for hypertension.  -Patient going to think about a NicoDerm patch -RN to provide smoking cessation education  DVT prophylaxis:   Lovenox   Code Status:     Full code  Family Communication:   none Disposition Plan:   Discharge home in 2-3 days              Consults called:  None  Admission status:   Admission - Telemetry    Willette Cluster NP [facility] Pager [phone]  If 7PM-7AM, please contact night-coverage [email or web address] Password [email or web address]  07/28/2016, 11:40 AM

## 2016-07-28 NOTE — Progress Notes (Addendum)
Pt. Prefers to sit in a chair than lying down on bed. Refused the chair to be reclined since legs are swollen. Education given but still refused.

## 2016-07-28 NOTE — Progress Notes (Signed)
Patient resting comfortably on 2L [place] with no respiratory distress noted at this time. BIPAP not needed at this time. RT will continue to monitor as needed. 

## 2016-07-28 NOTE — ED Notes (Signed)
NP at bedside.

## 2016-07-28 NOTE — ED Provider Notes (Addendum)
[facility]-EMERGENCY DEPT Provider Note   CSN: [id number] Arrival date & time: 07/28/16  [id number]     History   Chief Complaint Chief Complaint  Patient presents with  . Shortness of Breath    HPI Warren Ortiz is a 65 y.o. male.  Level V caveat for respiratory distress and mental status change. Patient arrives via EMS with shortness of breath that he states has been going on a long time. He was seen in the ED on September 5 for COPD exacerbation. He says he does have a history of asthma and COPD but denies any history of heart failure. States his cough is productive of green mucus. He is frequent falling asleep during triage. He denies any chest pain or back pain. He's had intermittent pain to his left groin for the past several days. Denies any falls or trauma. EMS reports his lung fields were clear. No ST elevation on EKG. Patient denies any cardiac history is never had a heart attack or any stents in his heart.   The history is provided by the patient and the EMS personnel. The history is limited by the condition of the patient.  Shortness of Breath  Associated symptoms include cough and leg swelling. Pertinent negatives include no fever, no headaches, no rhinorrhea, no chest pain, no vomiting and no abdominal pain.    Past Medical History:  Diagnosis Date  . Arthritis   . Hypertension     There are no active problems to display for this patient.   Past Surgical History:  Procedure Laterality Date  . TONSILLECTOMY         Home Medications    Prior to Admission medications   Medication Sig Start Date End Date Taking? Authorizing Provider  albuterol (PROVENTIL HFA;VENTOLIN HFA) 108 (90 Base) MCG/ACT inhaler Inhale 1-2 puffs into the lungs every 6 (six) hours as needed for wheezing or shortness of breath. 07/09/16   Maia PlanJoshua G Long, MD    Family History No family history on file.  Social History Social History  Substance Use Topics  . Smoking status: Current Every Day Smoker     Packs/day: 1.00    Types: Cigarettes  . Smokeless tobacco: Never Used  . Alcohol use Yes     Comment: occasionally     Allergies   Review of patient's allergies indicates not on file.   Review of Systems Review of Systems  Unable to perform ROS: Mental status change  Constitutional: Negative for activity change, appetite change and fever.  HENT: Negative for congestion and rhinorrhea.   Respiratory: Positive for cough and shortness of breath. Negative for chest tightness.   Cardiovascular: Positive for leg swelling. Negative for chest pain.  Gastrointestinal: Negative for abdominal pain, nausea and vomiting.  Genitourinary: Negative for dysuria, hematuria and testicular pain.  Musculoskeletal: Negative for back pain and myalgias.  Neurological: Negative for dizziness, weakness, light-headedness and headaches.  A complete 10 system review of systems was obtained and all systems are negative except as noted in the HPI and PMH.     Physical Exam Updated Vital Signs Pulse (!) 55   Temp 99 F (37.2 C) (Oral)   Resp 19   Ht 5\' 5"  (1.651 m)   Wt 285 lb (129.3 kg)   SpO2 96%   BMI 47.43 kg/m   Physical Exam  Constitutional: He is oriented to person, place, and time. He appears well-developed and well-nourished. No distress.  Morbidly obese, somnolent, frequent falling asleep  HENT:  Head: Normocephalic and  atraumatic.  Mouth/Throat: Oropharynx is clear and moist. No oropharyngeal exudate.  Eyes: Conjunctivae and EOM are normal. Pupils are equal, round, and reactive to light.  Neck: Normal range of motion. Neck supple.  No meningismus.  Cardiovascular: Normal rate, regular rhythm, normal heart sounds and intact distal pulses.   No murmur heard. Intact femoral pulses bilaterally  Pulmonary/Chest: Effort normal and breath sounds normal. No respiratory distress. He exhibits no tenderness.  Diminished breath sounds with faint expiratory wheezing  Abdominal: Soft. There is  no tenderness. There is no rebound and no guarding.  Musculoskeletal: Normal range of motion. He exhibits edema. He exhibits no tenderness.  +2 edema to knees bilaterally  Neurological: He is alert and oriented to person, place, and time. No cranial nerve deficit. He exhibits normal muscle tone. Coordination normal.  Somnolent, arouses to voice, frequent falls asleep, moves all extremities and follows commands  Skin: Skin is warm. He is diaphoretic.  Psychiatric: He has a normal mood and affect. His behavior is normal.  Nursing note and vitals reviewed.    ED Treatments / Results  Labs (all labs ordered are listed, but only abnormal results are displayed) Labs Reviewed  CBC WITH DIFFERENTIAL/PLATELET - Abnormal; Notable for the following:       Result Value   MCV 102.7 (*)    MCH 35.1 (*)    All other components within normal limits  COMPREHENSIVE METABOLIC PANEL - Abnormal; Notable for the following:    Glucose, Bld 109 (*)    Creatinine, Ser 1.29 (*)    Calcium 8.5 (*)    Total Protein 6.2 (*)    Albumin 2.5 (*)    AST 62 (*)    ALT 71 (*)    Alkaline Phosphatase 37 (*)    GFR calc non Af Amer 57 (*)    All other components within normal limits  TROPONIN I - Abnormal; Notable for the following:    Troponin I 0.03 (*)    All other components within normal limits  CBG MONITORING, ED - Abnormal; Notable for the following:    Glucose-Capillary 125 (*)    All other components within normal limits  I-STAT CHEM 8, ED - Abnormal; Notable for the following:    BUN 23 (*)    Creatinine, Ser 1.30 (*)    Glucose, Bld 110 (*)    Calcium, Ion 1.00 (*)    All other components within normal limits  I-STAT ARTERIAL BLOOD GAS, ED - Abnormal; Notable for the following:    pH, Arterial 7.287 (*)    pCO2 arterial 56.6 (*)    All other components within normal limits  I-STAT ARTERIAL BLOOD GAS, ED - Abnormal; Notable for the following:    pO2, Arterial 145.0 (*)    Bicarbonate 29.6 (*)      Acid-Base Excess 5.0 (*)    All other components within normal limits  MRSA PCR SCREENING  BRAIN NATRIURETIC PEPTIDE  TROPONIN I  TROPONIN I  BASIC METABOLIC PANEL  CBC  I-STAT TROPOININ, ED    EKG  EKG Interpretation  Date/Time:  Saturday July 28 2016 09:21:51 EDT Ventricular Rate:  41 PR Interval:    QRS Duration: 131 QT Interval:  472 QTC Calculation: 390 R Axis:   121 Text Interpretation:  Sinus bradycardia Borderline prolonged PR interval IVCD, consider atypical RBBB Probable lateral infarct, age indeterminate Minimal ST elevation, anterior leads Baseline wander in lead(s) III aVF V6 minimal ST elevation v3 Confirmed by Manus Gunning  MD, Asencion [place]oveday (  [id number]) on 07/28/2016 9:35:42 AM       Radiology Dg Chest Portable 1 View  Result Date: 07/28/2016 CLINICAL DATA:  Pt c/o SOB for 2+ weeks along with a productive cough with yellow sputum. Hx HTN, Current smoker-- 1ppd. EXAM: PORTABLE CHEST 1 VIEW COMPARISON:  07/09/2016 FINDINGS: Lung volumes are relatively low. There is lung base opacity consistent with a combination of atelectasis and chronic bronchitic change. No convincing pneumonia. No pulmonary edema. No pleural effusion or pneumothorax. Cardiac silhouette is normal in size. No mediastinal or hilar masses or evidence of adenopathy. Bony thorax is grossly intact. IMPRESSION: 1. No evidence of pneumonia or pulmonary edema. 2. Lung base opacity is similar to the prior exam allowing for lower lung volumes. This is likely combination of atelectasis and chronic bronchitic change. Electronically Signed   By: Amie Portland M.D.   On: 07/28/2016 11:15    Procedures Procedures (including critical care time)  Medications Ordered in ED Medications  aspirin chewable tablet 324 mg (not administered)  ipratropium-albuterol (DUONEB) 0.5-2.5 (3) MG/3ML nebulizer solution 3 mL (not administered)  methylPREDNISolone sodium succinate (SOLU-MEDROL) 125 mg/2 mL injection 125 mg (not  administered)     Initial Impression / Assessment and Plan / ED Course  I have reviewed the triage vital signs and the nursing notes.  Pertinent labs & imaging results that were available during my care of the patient were reviewed by me and considered in my medical decision making (see chart for details).  Clinical Course  Patient with what appears to be chronic shortness of breath presenting with cough and change in mental status. EKG difficult to obtain but shows no ST elevation. Recently treated for COPD exacerbation.  Concern for CO2 retention causing change in mental status  Patient placed on BiPAP's pending results of the ABG. ABG does show mild CO2 retention with pH 7.28 and PCO2 of 56. EKG discussed with Dr. Excell Seltzer cardiology who does not feel it meets STEMI criteria  Narcan given with improvement in mental status. He is more awake but agitated. Patient now upset states he is on methadone 80 mg a day.  Patient remains agitated. Low-dose fentanyl given. Refusing to wear BiPAP mask or nasal cannula.  Repeat ABG improved.  Additional nebs given. Patient agreeable to wear nasal cannula. Suspect COPD exacerbation. May have component fof CHF as well.  Admission d/w Gunnar Fusi NP.   CRITICAL CARE Performed by: Glynn Octave Total critical care time: 45 minutes Critical care time was exclusive of separately billable procedures and treating other patients. Critical care was necessary to treat or prevent imminent or life-threatening deterioration. Critical care was time spent personally by me on the following activities: development of treatment plan with patient and/or surrogate as well as nursing, discussions with consultants, evaluation of patient's response to treatment, examination of patient, obtaining history from patient or surrogate, ordering and performing treatments and interventions, ordering and review of laboratory studies, ordering and review of radiographic studies,  pulse oximetry and re-evaluation of patient's condition.    Final Clinical Impressions(s) / ED Diagnoses   Final diagnoses:  Acute hypercapnic respiratory failure (HCC)  COPD exacerbation (HCC)    New Prescriptions New Prescriptions   No medications on file     Glynn Octave, MD 07/28/16 1758    Glynn Octave, MD 07/28/16 1759

## 2016-07-29 DIAGNOSIS — J9602 Acute respiratory failure with hypercapnia: Secondary | ICD-10-CM

## 2016-07-29 DIAGNOSIS — J441 Chronic obstructive pulmonary disease with (acute) exacerbation: Principal | ICD-10-CM

## 2016-07-29 DIAGNOSIS — N179 Acute kidney failure, unspecified: Secondary | ICD-10-CM

## 2016-07-29 DIAGNOSIS — I10 Essential (primary) hypertension: Secondary | ICD-10-CM

## 2016-07-29 DIAGNOSIS — Z720 Tobacco use: Secondary | ICD-10-CM

## 2016-07-29 DIAGNOSIS — R600 Localized edema: Secondary | ICD-10-CM

## 2016-07-29 DIAGNOSIS — F1120 Opioid dependence, uncomplicated: Secondary | ICD-10-CM

## 2016-07-29 LAB — URINE MICROSCOPIC-ADD ON
BACTERIA UA: NONE SEEN
RBC / HPF: NONE SEEN RBC/hpf (ref 0–5)
Squamous Epithelial / LPF: NONE SEEN

## 2016-07-29 LAB — MAGNESIUM: MAGNESIUM: 1.9 mg/dL (ref 1.7–2.4)

## 2016-07-29 LAB — URINALYSIS, ROUTINE W REFLEX MICROSCOPIC
Bilirubin Urine: NEGATIVE
GLUCOSE, UA: NEGATIVE mg/dL
Ketones, ur: NEGATIVE mg/dL
LEUKOCYTES UA: NEGATIVE
Nitrite: NEGATIVE
PROTEIN: 100 mg/dL — AB
SPECIFIC GRAVITY, URINE: 1.015 (ref 1.005–1.030)
pH: 5.5 (ref 5.0–8.0)

## 2016-07-29 LAB — BASIC METABOLIC PANEL
ANION GAP: 9 (ref 5–15)
BUN: 19 mg/dL (ref 6–20)
CHLORIDE: 101 mmol/L (ref 101–111)
CO2: 26 mmol/L (ref 22–32)
CREATININE: 1.16 mg/dL (ref 0.61–1.24)
Calcium: 8.4 mg/dL — ABNORMAL LOW (ref 8.9–10.3)
GFR calc non Af Amer: >60 mL/min (ref >60)
Glucose, Bld: 115 mg/dL — ABNORMAL HIGH (ref 65–99)
Potassium: 4 mmol/L (ref 3.5–5.1)
SODIUM: 136 mmol/L (ref 135–145)

## 2016-07-29 LAB — CBC
HCT: 50.5 % (ref 39.0–52.0)
HEMOGLOBIN: 17.1 g/dL — AB (ref 13.0–17.0)
MCH: 34.1 pg — AB (ref 26.0–34.0)
MCHC: 33.9 g/dL (ref 30.0–36.0)
MCV: 100.6 fL — AB (ref 78.0–100.0)
PLATELETS: 167 10*3/uL (ref 150–400)
RBC: 5.02 MIL/uL (ref 4.22–5.81)
RDW: 13.8 % (ref 11.5–15.5)
WBC: 6.9 10*3/uL (ref 4.0–10.5)

## 2016-07-29 MED ORDER — BUDESONIDE 0.25 MG/2ML IN SUSP
0.2500 mg | Freq: Two times a day (BID) | RESPIRATORY_TRACT | Status: DC
Start: 2016-07-29 — End: 2016-08-01
  Administered 2016-07-29 – 2016-07-31 (×5): 0.25 mg via RESPIRATORY_TRACT
  Filled 2016-07-29 (×6): qty 2

## 2016-07-29 MED ORDER — LORAZEPAM 2 MG/ML IJ SOLN
0.0000 mg | Freq: Four times a day (QID) | INTRAMUSCULAR | Status: DC
  Administered 2016-07-29 (×2): 2 mg via INTRAVENOUS
  Administered 2016-07-30: 1 mg via INTRAVENOUS
  Filled 2016-07-29 (×3): qty 1

## 2016-07-29 MED ORDER — IPRATROPIUM-ALBUTEROL 0.5-2.5 (3) MG/3ML IN SOLN
3.0000 mL | Freq: Four times a day (QID) | RESPIRATORY_TRACT | Status: DC
  Administered 2016-07-29 (×3): 3 mL via RESPIRATORY_TRACT
  Filled 2016-07-29 (×3): qty 3

## 2016-07-29 MED ORDER — FLUTICASONE PROPIONATE 50 MCG/ACT NA SUSP
2.0000 | Freq: Every day | NASAL | Status: DC
  Administered 2016-07-29 – 2016-07-31 (×3): 2 via NASAL
  Filled 2016-07-29 (×2): qty 16

## 2016-07-29 MED ORDER — LORATADINE 10 MG PO TABS
10.0000 mg | ORAL_TABLET | Freq: Every day | ORAL | Status: DC
  Administered 2016-07-29 – 2016-07-31 (×3): 10 mg via ORAL
  Filled 2016-07-29 (×3): qty 1

## 2016-07-29 MED ORDER — FOLIC ACID 1 MG PO TABS
1.0000 mg | ORAL_TABLET | Freq: Every day | ORAL | Status: DC
  Administered 2016-07-29 – 2016-07-31 (×3): 1 mg via ORAL
  Filled 2016-07-29 (×3): qty 1

## 2016-07-29 MED ORDER — VITAMIN B-1 100 MG PO TABS
100.0000 mg | ORAL_TABLET | Freq: Every day | ORAL | Status: DC
  Administered 2016-07-29 – 2016-07-31 (×3): 100 mg via ORAL
  Filled 2016-07-29 (×3): qty 1

## 2016-07-29 MED ORDER — LISINOPRIL 20 MG PO TABS
20.0000 mg | ORAL_TABLET | Freq: Every day | ORAL | Status: DC
  Administered 2016-07-29: 20 mg via ORAL
  Filled 2016-07-29: qty 1

## 2016-07-29 MED ORDER — LORAZEPAM 2 MG/ML IJ SOLN: 0.0000 mg | Freq: Two times a day (BID) | INTRAMUSCULAR | Status: DC

## 2016-07-29 MED ORDER — LORAZEPAM 1 MG PO TABS
1.0000 mg | ORAL_TABLET | Freq: Four times a day (QID) | ORAL | Status: DC | PRN
  Administered 2016-07-29: 1 mg via ORAL
  Filled 2016-07-29: qty 1

## 2016-07-29 MED ORDER — IPRATROPIUM-ALBUTEROL 0.5-2.5 (3) MG/3ML IN SOLN
3.0000 mL | Freq: Three times a day (TID) | RESPIRATORY_TRACT | Status: DC
  Administered 2016-07-30: 3 mL via RESPIRATORY_TRACT
  Filled 2016-07-29: qty 3

## 2016-07-29 MED ORDER — THIAMINE HCL 100 MG/ML IJ SOLN
100.0000 mg | Freq: Every day | INTRAMUSCULAR | Status: DC
  Filled 2016-07-29: qty 2

## 2016-07-29 MED ORDER — GUAIFENESIN ER 600 MG PO TB12
1200.0000 mg | ORAL_TABLET | Freq: Two times a day (BID) | ORAL | Status: DC
  Administered 2016-07-29 – 2016-07-31 (×5): 1200 mg via ORAL
  Filled 2016-07-29 (×5): qty 2

## 2016-07-29 MED ORDER — ADULT MULTIVITAMIN W/MINERALS CH
1.0000 | ORAL_TABLET | Freq: Every day | ORAL | Status: DC
  Administered 2016-07-29 – 2016-07-31 (×3): 1 via ORAL
  Filled 2016-07-29 (×3): qty 1

## 2016-07-29 MED ORDER — IPRATROPIUM-ALBUTEROL 0.5-2.5 (3) MG/3ML IN SOLN: 3.0000 mL | RESPIRATORY_TRACT | Status: DC | PRN

## 2016-07-29 MED ORDER — LORAZEPAM 2 MG/ML IJ SOLN: 1.0000 mg | Freq: Four times a day (QID) | INTRAMUSCULAR | Status: DC | PRN

## 2016-07-29 MED ORDER — FUROSEMIDE 10 MG/ML IJ SOLN
40.0000 mg | Freq: Two times a day (BID) | INTRAMUSCULAR | Status: DC
  Administered 2016-07-29 – 2016-07-30 (×3): 40 mg via INTRAVENOUS
  Filled 2016-07-29 (×3): qty 4

## 2016-07-29 MED ORDER — PANTOPRAZOLE SODIUM 40 MG PO TBEC
40.0000 mg | DELAYED_RELEASE_TABLET | Freq: Every day | ORAL | Status: DC
  Administered 2016-07-29 – 2016-07-31 (×3): 40 mg via ORAL
  Filled 2016-07-29 (×3): qty 1

## 2016-07-29 NOTE — Progress Notes (Signed)
Patient resting comfortably on 2L  with no respiratory distress noted at this time. BIPAP not needed at this time. RT will continue to monitor as needed.

## 2016-07-29 NOTE — Evaluation (Signed)
Physical Therapy Evaluation Patient Details Name: Warren Ortiz Warren Ortiz MRN: [id number] DOB: Sep 15, 1951 Today's Date: 07/29/2016   History of Present Illness  Patient is a 65 yo male admitted 07/28/16 with SOB, cough, LE edema.  Patient with acute resp failure with hypercapnia.   PMH:  HTN, tobacco, COPD, AKI  Clinical Impression  Patient presents with problems listed below.  Will benefit from acute PT to maximize functional mobility prior to d/c home.  Feel patient will be able to reach Mod I level prior to d/c.  Do not anticipate any f/u PT needs at d/c.    Follow Up Recommendations No PT follow up;Supervision - Intermittent    Equipment Recommendations  Rolling walker with 5" wheels    Recommendations for Other Services       Precautions / Restrictions Precautions Precautions: None Restrictions Weight Bearing Restrictions: No      Mobility  Bed Mobility               General bed mobility comments: Patient in chair  Transfers Overall transfer level: Needs assistance Equipment used: Rolling walker (2 wheeled) Transfers: Sit to/from Stand Sit to Stand: Min guard         General transfer comment: Verbal cues for hand placement.  Assist for safety only.  Ambulation/Gait Ambulation/Gait assistance: Min guard;+2 safety/equipment Ambulation Distance (Feet): 175 Feet Assistive device: Rolling walker (2 wheeled) Gait Pattern/deviations: Step-through pattern;Decreased stride length Gait velocity: decreased Gait velocity interpretation: Below normal speed for age/gender General Gait Details: Patient with slow steady gait.  Using RW minimally - for balance only.  Stairs            Wheelchair Mobility    Modified Rankin (Stroke Patients Only)       Balance Overall balance assessment: No apparent balance deficits (not formally assessed)                                           Pertinent Vitals/Pain Pain Assessment: No/denies pain    Home  Living Family/patient expects to be discharged to:: Private residence Living Arrangements: Non-relatives/Friends;Other relatives Available Help at Discharge: Family;Friend(s);Available PRN/intermittently Type of Home: House Home Access: Stairs to enter Entrance Stairs-Rails: Doctor, general practiceight;Left Entrance Stairs-Number of Steps: 3 Home Layout: One level Home Equipment: None      Prior Function Level of Independence: Independent         Comments: Drives.  Patient is retired.     Hand Dominance        Extremity/Trunk Assessment   Upper Extremity Assessment: Overall WFL for tasks assessed           Lower Extremity Assessment: Generalized weakness         Communication   Communication: No difficulties (Did fall asleep mid-sentence several times.)  Cognition Arousal/Alertness: Lethargic Behavior During Therapy: Flat affect Overall Cognitive Status: Within Functional Limits for tasks assessed                      General Comments      Exercises     Assessment/Plan    PT Assessment Patient needs continued PT services  PT Problem List Decreased strength;Decreased balance;Decreased mobility;Decreased knowledge of use of DME;Cardiopulmonary status limiting activity;Obesity          PT Treatment Interventions DME instruction;Gait training;Stair training;Functional mobility training;Therapeutic exercise;Therapeutic activities;Balance training;Patient/family education    PT Goals (Current goals can be  found in the Care Plan section)  Acute Rehab PT Goals Patient Stated Goal: To get stronger - "I'm getting weak in that bed" PT Goal Formulation: With patient Time For Goal Achievement: 08/05/16 Potential to Achieve Goals: Good    Frequency Min 3X/week   Barriers to discharge Decreased caregiver support Patient alone during day    Co-evaluation               End of Session Equipment Utilized During Treatment: Gait belt;Oxygen Activity Tolerance: Patient  tolerated treatment well Patient left: in chair;with call bell/phone within reach Nurse Communication: Mobility status (Patient falling asleep during conversation)         Time: 1434-1450 PT Time Calculation (min) (ACUTE ONLY): 16 min   Charges:   PT Evaluation $PT Eval Moderate Complexity: 1 Procedure     PT G CodesVena Austria 08-20-2016, 5:31 PM Durenda Hurt. Renaldo Fiddler, [facility] Acute Rehab Services Pager [phone]

## 2016-07-29 NOTE — Progress Notes (Signed)
PROGRESS NOTE    Warren Ortiz  WUJ:[id number] DOB: Mar 08, 1951 DOA: 07/28/2016 PCP: No PCP Per Patient   Brief Narrative:  Warren Ortiz is a 65 y.o. male with a medical history significant for hypertension. Patient has a long history of smoking, apparently he carries a diagnosis of COPD. He was in the emergency department 07/09/16 for difficulty breathing, presumably for COPD exacerbation. Patient received prescriptions for prednisone, Zithromax, and albuterol inhaler. Patient states he completed the 5 days of prednisone and Zithromax and did feel that symptoms improved. Several days later however patient began having recurrent shortness of breath and a cough productive of yellow phlegm. Patient brought back to ED this morning by EMS. He was somnolent on arrival No fevers at home. No chest pain. Patient is urinating, he tries to watch his sodium intake closely. Patient complains of bilateral lower extremity swelling which started about one year ago. [facility]. No orthopnea. Patient does not want steroids as they cause weight gain. He has successfully lost weight through exercise and diet .    Assessment & Plan:   Principal Problem:   Acute respiratory failure with hypercapnia (HCC) Active Problems:   COPD exacerbation (HCC)   Acute hypercapnic respiratory failure (HCC)   Methadone use (HCC)   Hypertension   AKI (acute kidney injury) (HCC)   Tobacco abuse   Bilateral lower extremity edema  #1 acute respiratory failure with hypercapnia likely secondary to COPD exacerbation Patient presenting with acute respiratory failure with hypercapnia felt to be likely secondary to a COPD exacerbation. Patient was treated in the outpatient setting for COPD approximately 3 weeks prior to admission with Zithromax and prednisone with temporary improvement however worsening symptoms requiring admission. Consent for component of heart failure due to  lower extremity edema. Patient with clinical improvement. Patient off BiPAP. Continue IV Solu-Medrol 60 mg IV every 8 hours, IV Levaquin. Increase Mucinex to 1200 mg twice daily. Place on scheduled nebulizer treatments, Flonase, Claritin, Pulmicort, PPI, flutter valve. Continue IV Lasix. Follow.  #2 lower extremity edema Questionable etiology. Patient states worsening lower extremity edema since October after his Lasix were discontinued. Differential includes cardiac versus renal etiology versus liver. Patient with slightly elevated LFTs on admission. Repeat LFTs tomorrow. Check a UA with cultures and sensitivities to rule out proteinuria. Patient was not on a calcium channel blocker prior to admission. Check 2-D echo. Patient may have a component of right heart failure may be secondary to worsening COPD. Patient is -1.1 L over the past 24 hours. Continue Lasix 40 mg IV every 12 hours. Strict I's and O's. Daily weights. Follow.  #3 hypertension Patient on IV Lasix. Start lisinopril 20 mg daily and follow.  #4 tobacco abuse Tobacco cessation.  #5 acute kidney injury Improved with diuresis. Check a UA with cultures and sensitivities. Monitor renal function with initiation of ACE inhibitor. Follow.  #6 obstructive sleep apnea Could not tolerate C Pap.  #7 chronic methadone use Patient agitated/angry about reversal of his methadone with Narcan in the ED. Patient currently on home regimen of methadone. Monitor for sedation.  #8 EKG changes Concern for ST elevation in V3. EDP reviewed with Dr. Excell Seltzerooper of cardiology. Admitting physician recommendations were to trend troponins and check a 2-D echo. Patient asymptomatic. Troponin is minimally elevated and plateaued. 2-D echo pending.    DVT prophylaxis: Lovenox Code Status: Full Family Communication: Updated patient. No family at bedside. Disposition Plan: Remain the step down  unit today.   Consultants:   None  Procedures:   Chest x-ray  07/28/2016  2-D echo pending  Antimicrobials:   IV Levaquin 07/28/2016   Subjective: Patient sitting up in chair. Patient denies any chest pain. Patient states some improvement of shortness of breath. Patient seems preoccupied with something.  Objective: Vitals:   07/29/16 0000 07/29/16 0202 07/29/16 0430 07/29/16 0808  BP: (!) 160/77 (!) 152/96 (!) 166/83   Pulse: 75 67 85   Resp: 12 13 12    Temp:   98 F (36.7 C) 97.9 F (36.6 C)  TempSrc:   Oral Oral  SpO2: 96% 92% 95% 94%  Weight:   114.4 kg (252 lb 4.8 oz)   Height:        Intake/Output Summary (Last 24 hours) at 07/29/16 0940 Last data filed at 07/29/16 0400  Gross per 24 hour  Intake              300 ml  Output             1400 ml  Net            -1100 ml   Filed Weights   07/28/16 0933 07/28/16 1353 07/29/16 0430  Weight: 129.3 kg (285 lb) 116 kg (255 lb 11.7 oz) 114.4 kg (252 lb 4.8 oz)    Examination:  General exam: Appears calm and comfortable.Sitting up in chair.  Respiratory system: Deferred to poor air movement. Occasional scattered coarse breath sounds. No crackles noted. No wheezing. Respiratory effort normal. Cardiovascular system: S1 & S2 heard, RRR. No JVD, murmurs, rubs, gallops or clicks. 3+ bilateral lower extremity edema. Gastrointestinal system: Abdomen is nondistended, obese, soft and nontender. No organomegaly or masses felt. Normal bowel sounds heard. Central nervous system: Alert and oriented. No focal neurological deficits. Extremities: Symmetric 5 x 5 power. Skin: No rashes, lesions or ulcers Psychiatry: Judgement and insight appear fair. Mood & affect appropriate.     Data Reviewed: I have personally reviewed following labs and imaging studies  CBC:  Recent Labs Lab 07/28/16 0953 07/28/16 1003 07/29/16 0256  WBC 7.2  --  6.9  NEUTROABS 5.2  --   --   HGB 15.5 16.0 17.1*  HCT 45.4 47.0 50.5  MCV 102.7*  --  100.6*  PLT 153  --  167   Basic Metabolic Panel:  Recent  Labs Lab 07/28/16 0953 07/28/16 1003 07/29/16 0256  NA 138 138 136  K 4.1 3.9 4.0  CL 104 103 101  CO2 26  --  26  GLUCOSE 109* 110* 115*  BUN 19 23* 19  CREATININE 1.29* 1.30* 1.16  CALCIUM 8.5*  --  8.4*   GFR: Estimated Creatinine Clearance: 76.2 mL/min (by C-G formula based on SCr of 1.16 mg/dL). Liver Function Tests:  Recent Labs Lab 07/28/16 0953  AST 62*  ALT 71*  ALKPHOS 37*  BILITOT 0.7  PROT 6.2*  ALBUMIN 2.5*   No results for input(s): LIPASE, AMYLASE in the last 168 hours. No results for input(s): AMMONIA in the last 168 hours. Coagulation Profile: No results for input(s): INR, PROTIME in the last 168 hours. Cardiac Enzymes:  Recent Labs Lab 07/28/16 1405 07/28/16 1940 07/28/16 2256  TROPONINI 0.03* 0.03* <0.03   BNP (last 3 results) No results for input(s): PROBNP in the last 8760 hours. HbA1C: No results for input(s): HGBA1C in the last 72 hours. CBG:  Recent Labs Lab 07/28/16 0919  GLUCAP 125*   Lipid Profile: No results for  input(s): CHOL, HDL, LDLCALC, TRIG, CHOLHDL, LDLDIRECT in the last 72 hours. Thyroid Function Tests: No results for input(s): TSH, T4TOTAL, FREET4, T3FREE, THYROIDAB in the last 72 hours. Anemia Panel: No results for input(s): VITAMINB12, FOLATE, FERRITIN, TIBC, IRON, RETICCTPCT in the last 72 hours. Sepsis Labs: No results for input(s): PROCALCITON, LATICACIDVEN in the last 168 hours.  Recent Results (from the past 240 hour(s))  MRSA PCR Screening     Status: None   Collection Time: 07/28/16  1:51 PM  Result Value Ref Range Status   MRSA by PCR NEGATIVE NEGATIVE Final    Comment:        The GeneXpert MRSA Assay (FDA approved for NASAL specimens only), is one component of a comprehensive MRSA colonization surveillance program. It is not intended to diagnose MRSA infection nor to guide or monitor treatment for MRSA infections.          Radiology Studies: Dg Chest Portable 1 View  Result Date:  07/28/2016 CLINICAL DATA:  Pt c/o SOB for 2+ weeks along with a productive cough with yellow sputum. Hx HTN, Current smoker-- 1ppd. EXAM: PORTABLE CHEST 1 VIEW COMPARISON:  07/09/2016 FINDINGS: Lung volumes are relatively low. There is lung base opacity consistent with a combination of atelectasis and chronic bronchitic change. No convincing pneumonia. No pulmonary edema. No pleural effusion or pneumothorax. Cardiac silhouette is normal in size. No mediastinal or hilar masses or evidence of adenopathy. Bony thorax is grossly intact. IMPRESSION: 1. No evidence of pneumonia or pulmonary edema. 2. Lung base opacity is similar to the prior exam allowing for lower lung volumes. This is likely combination of atelectasis and chronic bronchitic change. Electronically Signed   By: Amie Portland M.D.   On: 07/28/2016 11:15        Scheduled Meds: . aspirin  324 mg Oral Once  . budesonide (PULMICORT) nebulizer solution  0.25 mg Nebulization BID  . enoxaparin (LOVENOX) injection  40 mg Subcutaneous Q24H  . fentaNYL (SUBLIMAZE) injection  100 mcg Intravenous Once  . fluticasone  2 spray Each Nare Daily  . furosemide  40 mg Intravenous Q12H  . guaiFENesin  1,200 mg Oral BID  . ipratropium-albuterol  3 mL Nebulization Once  . ipratropium-albuterol  3 mL Nebulization Q6H  . levofloxacin (LEVAQUIN) IV  750 mg Intravenous Q24H  . loratadine  10 mg Oral Daily  . methadone  80 mg Oral Daily  . methylPREDNISolone (SOLU-MEDROL) injection  60 mg Intravenous TID  . pantoprazole  40 mg Oral Q0600  . sodium chloride flush  3 mL Intravenous Q12H   Continuous Infusions:    LOS: 1 day    Time spent: 35 minutes    THOMPSON,DANIEL, MD [facility] Pager [phone]  If 7PM-7AM, please contact night-coverage [email or web address] Password [facility] 07/29/2016, 9:40 AM

## 2016-07-30 ENCOUNTER — Inpatient Hospital Stay (HOSPITAL_COMMUNITY): Payer: Non-veteran care

## 2016-07-30 DIAGNOSIS — R0600 Dyspnea, unspecified: Secondary | ICD-10-CM | POA: Diagnosis not present

## 2016-07-30 DIAGNOSIS — F10939 Alcohol use, unspecified with withdrawal, unspecified: Secondary | ICD-10-CM | POA: Diagnosis present

## 2016-07-30 DIAGNOSIS — F10239 Alcohol dependence with withdrawal, unspecified: Secondary | ICD-10-CM

## 2016-07-30 LAB — HEPATIC FUNCTION PANEL
ALBUMIN: 3.2 g/dL — AB (ref 3.5–5.0)
ALK PHOS: 36 U/L — AB (ref 38–126)
ALT: 66 U/L — AB (ref 17–63)
AST: 45 U/L — AB (ref 15–41)
BILIRUBIN DIRECT: 0.1 mg/dL (ref 0.1–0.5)
BILIRUBIN TOTAL: 0.4 mg/dL (ref 0.3–1.2)
Indirect Bilirubin: 0.3 mg/dL (ref 0.3–0.9)
Total Protein: 7.3 g/dL (ref 6.5–8.1)

## 2016-07-30 LAB — BASIC METABOLIC PANEL
ANION GAP: 11 (ref 5–15)
BUN: 31 mg/dL — ABNORMAL HIGH (ref 6–20)
CHLORIDE: 98 mmol/L — AB (ref 101–111)
CO2: 28 mmol/L (ref 22–32)
Calcium: 8.8 mg/dL — ABNORMAL LOW (ref 8.9–10.3)
Creatinine, Ser: 1.53 mg/dL — ABNORMAL HIGH (ref 0.61–1.24)
GFR calc non Af Amer: 47 mL/min — ABNORMAL LOW (ref >60)
GFR, EST AFRICAN AMERICAN: 54 mL/min — AB (ref >60)
GLUCOSE: 137 mg/dL — AB (ref 65–99)
POTASSIUM: 4.2 mmol/L (ref 3.5–5.1)
Sodium: 137 mmol/L (ref 135–145)

## 2016-07-30 LAB — CBC
HEMATOCRIT: 50.5 % (ref 39.0–52.0)
HEMOGLOBIN: 17.3 g/dL — AB (ref 13.0–17.0)
MCH: 34.3 pg — ABNORMAL HIGH (ref 26.0–34.0)
MCHC: 34.3 g/dL (ref 30.0–36.0)
MCV: 100.2 fL — AB (ref 78.0–100.0)
Platelets: 163 10*3/uL (ref 150–400)
RBC: 5.04 MIL/uL (ref 4.22–5.81)
RDW: 14.1 % (ref 11.5–15.5)
WBC: 15 10*3/uL — AB (ref 4.0–10.5)

## 2016-07-30 LAB — URINE CULTURE: CULTURE: NO GROWTH

## 2016-07-30 LAB — ECHOCARDIOGRAM COMPLETE
Height: 65 in
WEIGHTICAEL: 4059.2 [oz_av]

## 2016-07-30 MED ORDER — FUROSEMIDE 10 MG/ML IJ SOLN
80.0000 mg | Freq: Three times a day (TID) | INTRAMUSCULAR | Status: DC
  Administered 2016-07-30: 80 mg via INTRAVENOUS
  Filled 2016-07-30: qty 8

## 2016-07-30 MED ORDER — LORAZEPAM 2 MG/ML IJ SOLN: 2.0000 mg | INTRAMUSCULAR | Status: DC | PRN

## 2016-07-30 MED ORDER — LORAZEPAM 1 MG PO TABS: 2.0000 mg | ORAL_TABLET | ORAL | Status: DC | PRN

## 2016-07-30 MED ORDER — FUROSEMIDE 10 MG/ML IJ SOLN: 40.0000 mg | Freq: Two times a day (BID) | INTRAMUSCULAR | Status: DC

## 2016-07-30 MED ORDER — LISINOPRIL 20 MG PO TABS
40.0000 mg | ORAL_TABLET | Freq: Every day | ORAL | Status: DC
  Administered 2016-07-30 – 2016-07-31 (×2): 40 mg via ORAL
  Filled 2016-07-30 (×2): qty 2

## 2016-07-30 MED ORDER — PERFLUTREN LIPID MICROSPHERE
INTRAVENOUS | Status: AC
Start: 2016-07-30 — End: 2016-07-30
  Administered 2016-07-30: 2 mL
  Filled 2016-07-30: qty 10

## 2016-07-30 MED ORDER — LEVOFLOXACIN 750 MG PO TABS
750.0000 mg | ORAL_TABLET | Freq: Every day | ORAL | Status: DC
  Administered 2016-07-30: 750 mg via ORAL
  Filled 2016-07-30 (×2): qty 1

## 2016-07-30 MED ORDER — METHYLPREDNISOLONE SODIUM SUCC 125 MG IJ SOLR
60.0000 mg | Freq: Two times a day (BID) | INTRAMUSCULAR | Status: DC
  Administered 2016-07-30 – 2016-07-31 (×3): 60 mg via INTRAVENOUS
  Filled 2016-07-30 (×3): qty 2

## 2016-07-30 MED ORDER — LORAZEPAM 2 MG/ML IJ SOLN
2.0000 mg | INTRAMUSCULAR | Status: DC | PRN
  Filled 2016-07-30: qty 1

## 2016-07-30 MED ORDER — LORAZEPAM 2 MG/ML IJ SOLN
2.0000 mg | Freq: Once | INTRAMUSCULAR | Status: AC
  Administered 2016-07-30: 2 mg via INTRAVENOUS

## 2016-07-30 MED ORDER — FUROSEMIDE 40 MG PO TABS
40.0000 mg | ORAL_TABLET | Freq: Every day | ORAL | Status: DC
  Administered 2016-07-31: 40 mg via ORAL
  Filled 2016-07-30: qty 1

## 2016-07-30 MED ORDER — IPRATROPIUM-ALBUTEROL 0.5-2.5 (3) MG/3ML IN SOLN
3.0000 mL | Freq: Two times a day (BID) | RESPIRATORY_TRACT | Status: DC
  Administered 2016-07-30 – 2016-07-31 (×2): 3 mL via RESPIRATORY_TRACT
  Filled 2016-07-30 (×3): qty 3

## 2016-07-30 NOTE — Progress Notes (Signed)
Nutrition Brief Note  RD consult to assess nutritional status and needs per COPD GOLD protocol.   Wt Readings from Last 15 Encounters:  07/30/16 253 lb 11.2 oz (115.1 kg)   Warren Ortiz is a 65 y.o. male with a medical history significant for hypertension. Patient has a long history of smoking, apparently he carries a diagnosis of COPD. He was in the emergency department 07/09/16 for difficulty breathing, presumably for COPD exacerbation. Patient received prescriptions for prednisone, Zithromax, and albuterol inhaler. Patient states he completed the 5 days of prednisone and Zithromax and did feel that symptoms improved. Several days later however patient began having recurrent shortness of breath and a cough productive of yellow phlegm. Patient brought back to ED this morning by EMS. He was somnolent on arrival No fevers at home. No chest pain. Patient is urinating, he tries to watch his sodium intake closely. Patient complains of bilateral lower extremity swelling which started about one year ago. [facility]. No orthopnea. Patient does not want steroids as they cause weight gain. He has successfully lost weight through exercise and diet .   Pt admitted with acute COPD exacerbation and lower extremity edema.   Pt sleeping soundly at time of visit.   Case discussed with RN, who reveals pt was just given Ativan secondary to agitation. Pt has been eating well, consumed 100% of breakfast this morning. Noted 32# (11.2%) wt loss over the past 48 hours, likely due to agressive diuresis. Per H&P, pt has been losing wt intentionally via lifestyle modifications.   Nutrition-Focused physical exam completed. Findings are no fat depletion, no muscle depletion, and mild to moderate edema.   Body mass index is 42.22 kg/m. Patient meets criteria for extreme obesity, class III based on current BMI.   Current diet order is Heart Healthy, patient is  consuming approximately 90-100% of meals at this time. Labs and medications reviewed.   No nutrition interventions warranted at this time. If nutrition issues arise, please consult RD.   Jazlen Ogarro A. Mayford KnifeWilliams, RD, LDN, CDE Pager: [phone] After hours Pager: [phone]

## 2016-07-30 NOTE — Progress Notes (Signed)
Pt. Nasal cannula taken off at 12:15. Pt walked around unit at 1430, tolerated fine. Room air O2 lowest 87%. Pt. Resting in chair on RA 94%

## 2016-07-30 NOTE — Progress Notes (Signed)
Pt not needing bipap at this time.  RT will monitor for need.

## 2016-07-30 NOTE — Progress Notes (Signed)
PROGRESS NOTE    Warren Ortiz  ZOX:[id number] DOB: [birth year] DOA: 07/28/2016 PCP: No PCP Per Patient   Brief Narrative:  Warren Ortiz is a 65 y.o. male with a medical history significant for hypertension. Patient has a long history of smoking, apparently he carries a diagnosis of COPD. He was in the emergency department 07/09/16 for difficulty breathing, presumably for COPD exacerbation. Patient received prescriptions for prednisone, Zithromax, and albuterol inhaler. Patient states he completed the 5 days of prednisone and Zithromax and did feel that symptoms improved. Several days later however patient began having recurrent shortness of breath and a cough productive of yellow phlegm. Patient brought back to ED this morning by EMS. He was somnolent on arrival No fevers at home. No chest pain. Patient is urinating, he tries to watch his sodium intake closely. Patient complains of bilateral lower extremity swelling which started about one year ago. [facility]. No orthopnea. Patient does not want steroids as they cause weight gain. He has successfully lost weight through exercise and diet .    Assessment & Plan:   Principal Problem:   Acute respiratory failure with hypercapnia (HCC) Active Problems:   COPD exacerbation (HCC)   Acute hypercapnic respiratory failure (HCC)   Methadone use (HCC)   Hypertension   AKI (acute kidney injury) (HCC)   Tobacco abuse   Bilateral lower extremity edema   Alcohol withdrawal (HCC)  #1 acute respiratory failure with hypercapnia likely secondary to COPD exacerbation +/- RHF Patient presenting with acute respiratory failure with hypercapnia felt to be likely secondary to a COPD exacerbation +/- RHF. Patient was treated in the outpatient setting for COPD approximately 3 weeks prior to admission with Zithromax and prednisone with temporary improvement however worsening symptoms requiring admission.  Concern for component of heart failure due to lower extremity edema. Patient with clinical improvement. Patient off BiPAP. Decrease IV Solu-Medrol 60 mg IV every 12 hours. Change IV Levaquin to oral Levaquin. Continue Mucinex. Continue scheduled nebulizers, Flonase, Claritin, Pulmicort, PPI, flutter valve. Continue IV Lasix. Follow.  #2 lower extremity edema/probable right heart failure Questionable etiology. Patient states worsening lower extremity edema since October after his Lasix were discontinued. Differential includes cardiac versus renal etiology versus liver. Patient with slightly elevated LFTs on admission. Repeat LFTs pending. UA with 100 protein. Patient was not on a calcium channel blocker prior to admission. 2-D echo pending. Patient may have a component of right heart failure may be secondary to worsening COPD. Patient is 0.725 L over the past 24 hours. Patient is -1.8 L during this hospitalization. Increase Lasix to 80 mg IV every 8 hours. Strict I's and O's. Daily weights. Follow.  #3 hypertension Patient on IV Lasix. Increase lisinopril to 40 mg daily.   #4 tobacco abuse Tobacco cessation.  #5 acute kidney injury Improved with diuresis. UA with negative nitrite, negative leukocytes, 100 proteins.l Labs pending this morning. Monitor renal function with initiation of ACE inhibitor. Follow.  #6 obstructive sleep apnea Could not tolerate C Pap.  #7 chronic methadone use Patient agitated/angry about reversal of his methadone with Narcan in the ED. Patient currently on home regimen of methadone. Monitor for sedation.  #8 EKG changes Concern for ST elevation in V3. EDP reviewed with Dr. Excell Seltzer of cardiology. Admitting physician recommendations were to trend troponins and check a 2-D echo. Patient asymptomatic. Troponin is minimally elevated and plateaued. 2-D echo pending.  #9 ETOH withdrawal Patient noted to have  significant tremors in the room. Patient states not feeling well.  Discontinue MedSurg Ativan withdrawal protocol. Place on stepdown Ativan withdrawal protocol. Ativan 2 mg IV 1 now.    DVT prophylaxis: Lovenox Code Status: Full Family Communication: Updated patient. No family at bedside. Disposition Plan: Remain the step down unit today.   Consultants:   None  Procedures:   Chest x-ray 07/28/2016  2-D echo pending  Antimicrobials:   IV Levaquin 07/28/2016>>>>oral levaquin 07/30/2016   Subjective: Patient states not feeling well. Patient with diffuse tremors. Patient denies any chest pain. Patient states some improvement of shortness of breath.  Objective: Vitals:   07/30/16 0801 07/30/16 0846 07/30/16 0900 07/30/16 0907  BP:    (!) 166/90  Pulse: 87  87 88  Resp: 19  20   Temp: 98 F (36.7 C)     TempSrc: Oral     SpO2: 94% 93% 94%   Weight:      Height:        Intake/Output Summary (Last 24 hours) at 07/30/16 1006 Last data filed at 07/30/16 0500  Gross per 24 hour  Intake              630 ml  Output             1345 ml  Net             -715 ml   Filed Weights   07/28/16 1353 07/29/16 0430 07/30/16 0417  Weight: 116 kg (255 lb 11.7 oz) 114.4 kg (252 lb 4.8 oz) 115.1 kg (253 lb 11.2 oz)    Examination:  General exam: Patient with diffuse tremors. Sitting up in chair.  Respiratory system: Poor to fair air movement. Occasional scattered coarse breath sounds. No crackles noted. No wheezing. Respiratory effort normal. Cardiovascular system: S1 & S2 heard, RRR. No JVD, murmurs, rubs, gallops or clicks. 3+ bilateral lower extremity edema. Gastrointestinal system: Abdomen is nondistended, obese, soft and nontender. No organomegaly or masses felt. Normal bowel sounds heard. Central nervous system: Alert and oriented. No focal neurological deficits. Extremities: Symmetric 5 x 5 power. Skin: No rashes, lesions or ulcers Psychiatry: Judgement and insight appear fair. Mood & affect appropriate.     Data Reviewed: I have  personally reviewed following labs and imaging studies  CBC:  Recent Labs Lab 07/28/16 0953 07/28/16 1003 07/29/16 0256 07/30/16 0439  WBC 7.2  --  6.9 15.0*  NEUTROABS 5.2  --   --   --   HGB 15.5 16.0 17.1* 17.3*  HCT 45.4 47.0 50.5 50.5  MCV 102.7*  --  100.6* 100.2*  PLT 153  --  167 163   Basic Metabolic Panel:  Recent Labs Lab 07/28/16 0953 07/28/16 1003 07/29/16 0256 07/29/16 1002  NA 138 138 136  --   K 4.1 3.9 4.0  --   CL 104 103 101  --   CO2 26  --  26  --   GLUCOSE 109* 110* 115*  --   BUN 19 23* 19  --   CREATININE 1.29* 1.30* 1.16  --   CALCIUM 8.5*  --  8.4*  --   MG  --   --   --  1.9   GFR: Estimated Creatinine Clearance: 76.4 mL/min (by C-G formula based on SCr of 1.16 mg/dL). Liver Function Tests:  Recent Labs Lab 07/28/16 0953  AST 62*  ALT 71*  ALKPHOS 37*  BILITOT 0.7  PROT 6.2*  ALBUMIN 2.5*   No results  for input(s): LIPASE, AMYLASE in the last 168 hours. No results for input(s): AMMONIA in the last 168 hours. Coagulation Profile: No results for input(s): INR, PROTIME in the last 168 hours. Cardiac Enzymes:  Recent Labs Lab 07/28/16 1405 07/28/16 1940 07/28/16 2256  TROPONINI 0.03* 0.03* <0.03   BNP (last 3 results) No results for input(s): PROBNP in the last 8760 hours. HbA1C: No results for input(s): HGBA1C in the last 72 hours. CBG:  Recent Labs Lab 07/28/16 0919  GLUCAP 125*   Lipid Profile: No results for input(s): CHOL, HDL, LDLCALC, TRIG, CHOLHDL, LDLDIRECT in the last 72 hours. Thyroid Function Tests: No results for input(s): TSH, T4TOTAL, FREET4, T3FREE, THYROIDAB in the last 72 hours. Anemia Panel: No results for input(s): VITAMINB12, FOLATE, FERRITIN, TIBC, IRON, RETICCTPCT in the last 72 hours. Sepsis Labs: No results for input(s): PROCALCITON, LATICACIDVEN in the last 168 hours.  Recent Results (from the past 240 hour(s))  MRSA PCR Screening     Status: None   Collection Time: 07/28/16  1:51 PM    Result Value Ref Range Status   MRSA by PCR NEGATIVE NEGATIVE Final    Comment:        The GeneXpert MRSA Assay (FDA approved for NASAL specimens only), is one component of a comprehensive MRSA colonization surveillance program. It is not intended to diagnose MRSA infection nor to guide or monitor treatment for MRSA infections.          Radiology Studies: Dg Chest Portable 1 View  Result Date: 07/28/2016 CLINICAL DATA:  Pt c/o SOB for 2+ weeks along with a productive cough with yellow sputum. Hx HTN, Current smoker-- 1ppd. EXAM: PORTABLE CHEST 1 VIEW COMPARISON:  07/09/2016 FINDINGS: Lung volumes are relatively low. There is lung base opacity consistent with a combination of atelectasis and chronic bronchitic change. No convincing pneumonia. No pulmonary edema. No pleural effusion or pneumothorax. Cardiac silhouette is normal in size. No mediastinal or hilar masses or evidence of adenopathy. Bony thorax is grossly intact. IMPRESSION: 1. No evidence of pneumonia or pulmonary edema. 2. Lung base opacity is similar to the prior exam allowing for lower lung volumes. This is likely combination of atelectasis and chronic bronchitic change. Electronically Signed   By: Amie Portland M.D.   On: 07/28/2016 11:15        Scheduled Meds: . aspirin  324 mg Oral Once  . budesonide (PULMICORT) nebulizer solution  0.25 mg Nebulization BID  . enoxaparin (LOVENOX) injection  40 mg Subcutaneous Q24H  . fentaNYL (SUBLIMAZE) injection  100 mcg Intravenous Once  . fluticasone  2 spray Each Nare Daily  . folic acid  1 mg Oral Daily  . furosemide  40 mg Intravenous Q12H  . guaiFENesin  1,200 mg Oral BID  . ipratropium-albuterol  3 mL Nebulization Once  . ipratropium-albuterol  3 mL Nebulization BID  . levofloxacin (LEVAQUIN) IV  750 mg Intravenous Q24H  . lisinopril  40 mg Oral Daily  . loratadine  10 mg Oral Daily  . methadone  80 mg Oral Daily  . methylPREDNISolone (SOLU-MEDROL) injection  60  mg Intravenous Q12H  . multivitamin with minerals  1 tablet Oral Daily  . pantoprazole  40 mg Oral Q0600  . sodium chloride flush  3 mL Intravenous Q12H  . thiamine  100 mg Oral Daily   Or  . thiamine  100 mg Intravenous Daily   Continuous Infusions:    LOS: 2 days    Time spent: 35 minutes  [facility] Sca AffiliateHOMPSON,Jonuel Butterfield, MD [facility] Pager [phone]  If 7PM-7AM, please contact night-coverage [email or web address] Password [email or web address] 07/30/2016, 10:06 AM

## 2016-07-30 NOTE — Evaluation (Signed)
Occupational Therapy Evaluation Patient Details Name: Warren Ortiz MRN: [id number] DOB: [birth year] Today's Date: 07/30/2016    History of Present Illness Patient is a 65 yo male admitted 07/28/16 with SOB, cough, LE edema.  Patient with acute resp failure with hypercapnia. CIWA protocol.  PMH:  HTN, tobacco, COPD, AKI, polysubstance abuse.   Clinical Impression   Pt is independent at baseline. Pt with lethargy, difficult to assess ADL thoroughly. Tremor interfering with self feeding. Pt with 02 sats to 87% on RA with ambulation with RW. Pt without awareness of his situation, repeatedly asking to go home and becoming tearful. Will follow acutely.    Follow Up Recommendations  No OT follow up    Equipment Recommendations   (to be determined)    Recommendations for Other Services       Precautions / Restrictions Precautions Precautions: Fall Precaution Comments: watch sats Restrictions Weight Bearing Restrictions: No      Mobility Bed Mobility               General bed mobility comments: Patient in chair  Transfers Overall transfer level: Needs assistance Equipment used: Rolling walker (2 wheeled) Transfers: Sit to/from Stand Sit to Stand: Min guard         General transfer comment: Verbal cues for hand placement.  Assist for safety only.    Balance                                            ADL Overall ADL's : Needs assistance/impaired Eating/Feeding: Set up;Sitting Eating/Feeding Details (indicate cue type and reason): tremor interfering with ability to drink without lid/straw. Grooming: Wash/dry hands;Wash/dry face;Minimal assistance;Sitting           Upper Body Dressing : Minimal assistance;Sitting   Lower Body Dressing: Maximal assistance;Sit to/from stand   Toilet Transfer: Min guard;Ambulation;Comfort height toilet;RW   Toileting- Clothing Manipulation and Hygiene: Minimal assistance;Sit to/from stand       Functional  mobility during ADLs: Min guard;Rolling walker;+2 for safety/equipment General ADL Comments: Pt falling asleep once returned to chair, difficult to assess ADL.     Vision     Perception     Praxis      Pertinent Vitals/Pain Pain Assessment: No/denies pain     Hand Dominance Right   Extremity/Trunk Assessment Upper Extremity Assessment Upper Extremity Assessment: RUE deficits/detail;LUE deficits/detail RUE Deficits / Details: tremor, edematous RUE Coordination: decreased fine motor;decreased gross motor LUE Deficits / Details: tremor, edematous LUE Coordination: decreased fine motor;decreased gross motor   Lower Extremity Assessment Lower Extremity Assessment: Defer to PT evaluation       Communication Communication Communication: No difficulties   Cognition Arousal/Alertness: Lethargic Behavior During Therapy: Flat affect (crying at one point) Overall Cognitive Status: Impaired/Different from baseline Area of Impairment: Orientation;Safety/judgement Orientation Level: Situation       Safety/Judgement: Decreased awareness of deficits     General Comments: Pt repeatedly stating he wanted to go home.   General Comments       Exercises       Shoulder Instructions      Home Living Family/patient expects to be discharged to:: Private residence Living Arrangements: Non-relatives/Friends;Other relatives Available Help at Discharge: Family;Friend(s);Available PRN/intermittently Type of Home: House Home Access: Stairs to enter Entergy CorporationEntrance Stairs-Number of Steps: 3 Entrance Stairs-Rails: Right;Left Home Layout: One level     Bathroom Shower/Tub: Tub/shower unit  Bathroom Toilet: Standard     Home Equipment: None          Prior Functioning/Environment Level of Independence: Independent        Comments: Drives.  Patient is retired.        OT Problem List: Decreased strength;Decreased activity tolerance;Impaired balance (sitting and/or  standing);Decreased coordination;Decreased cognition;Decreased knowledge of use of DME or AE;Cardiopulmonary status limiting activity;Obesity;Impaired UE functional use;Increased edema   OT Treatment/Interventions: Self-care/ADL training;DME and/or AE instruction;Patient/family education;Balance training;Therapeutic activities    OT Goals(Current goals can be found in the care plan section) Acute Rehab OT Goals Patient Stated Goal: to go home OT Goal Formulation: With patient Time For Goal Achievement: 08/06/16 Potential to Achieve Goals: Good ADL Goals Pt Will Perform Grooming: with modified independence;sitting Pt Will Perform Upper Body Dressing: with modified independence;sitting Pt Will Perform Lower Body Dressing: with modified independence;sit to/from stand Pt Will Transfer to Toilet: with modified independence;ambulating;regular height toilet Pt Will Perform Toileting - Clothing Manipulation and hygiene: with modified independence;sit to/from stand Pt Will Perform Tub/Shower Transfer: Tub transfer;ambulating;with modified independence  OT Frequency: Min 2X/week   Barriers to D/C:            Co-evaluation              End of Session Equipment Utilized During Treatment: Rolling walker;Gait belt  Activity Tolerance: Patient limited by lethargy Patient left: in chair;with call bell/phone within reach   Time: 1422-1439 OT Time Calculation (min): 17 min Charges:  OT General Charges $OT Visit: 1 Procedure OT Evaluation $OT Eval Moderate Complexity: 1 Procedure G-Codes:    Evern Bio 07/30/2016, 3:06 PM  [phone]

## 2016-07-30 NOTE — Progress Notes (Signed)
Pt lives at home, needs rw. Pt sleeping on my visit. Have called [facility] to see who we can get walker from. Will cont to follow.

## 2016-07-31 DIAGNOSIS — I509 Heart failure, unspecified: Secondary | ICD-10-CM

## 2016-07-31 DIAGNOSIS — I50810 Right heart failure, unspecified: Secondary | ICD-10-CM

## 2016-07-31 DIAGNOSIS — D72829 Elevated white blood cell count, unspecified: Secondary | ICD-10-CM

## 2016-07-31 LAB — CBC
HCT: 53.5 % — ABNORMAL HIGH (ref 39.0–52.0)
Hemoglobin: 18 g/dL — ABNORMAL HIGH (ref 13.0–17.0)
MCH: 34.2 pg — ABNORMAL HIGH (ref 26.0–34.0)
MCHC: 33.6 g/dL (ref 30.0–36.0)
MCV: 101.7 fL — ABNORMAL HIGH (ref 78.0–100.0)
PLATELETS: 182 10*3/uL (ref 150–400)
RBC: 5.26 MIL/uL (ref 4.22–5.81)
RDW: 14.2 % (ref 11.5–15.5)
WBC: 14.5 10*3/uL — AB (ref 4.0–10.5)

## 2016-07-31 LAB — COMPREHENSIVE METABOLIC PANEL
ALK PHOS: 43 U/L (ref 38–126)
ALT: 73 U/L — AB (ref 17–63)
ANION GAP: 10 (ref 5–15)
AST: 55 U/L — ABNORMAL HIGH (ref 15–41)
Albumin: 3.2 g/dL — ABNORMAL LOW (ref 3.5–5.0)
BUN: 36 mg/dL — ABNORMAL HIGH (ref 6–20)
CALCIUM: 9.1 mg/dL (ref 8.9–10.3)
CO2: 27 mmol/L (ref 22–32)
CREATININE: 1.35 mg/dL — AB (ref 0.61–1.24)
Chloride: 99 mmol/L — ABNORMAL LOW (ref 101–111)
GFR, EST NON AFRICAN AMERICAN: 54 mL/min — AB (ref >60)
Glucose, Bld: 126 mg/dL — ABNORMAL HIGH (ref 65–99)
Potassium: 4.1 mmol/L (ref 3.5–5.1)
Sodium: 136 mmol/L (ref 135–145)
Total Bilirubin: 0.5 mg/dL (ref 0.3–1.2)
Total Protein: 6.9 g/dL (ref 6.5–8.1)

## 2016-07-31 LAB — GLUCOSE, CAPILLARY: GLUCOSE-CAPILLARY: 129 mg/dL — AB (ref 65–99)

## 2016-07-31 LAB — MAGNESIUM: MAGNESIUM: 2.1 mg/dL (ref 1.7–2.4)

## 2016-07-31 MED ORDER — PREDNISONE 50 MG PO TABS
60.0000 mg | ORAL_TABLET | Freq: Two times a day (BID) | ORAL | Status: DC
  Administered 2016-07-31: 60 mg via ORAL
  Filled 2016-07-31: qty 1

## 2016-07-31 MED ORDER — AMLODIPINE BESYLATE 5 MG PO TABS
5.0000 mg | ORAL_TABLET | Freq: Every day | ORAL | Status: AC
  Administered 2016-07-31: 5 mg via ORAL
  Filled 2016-07-31: qty 1

## 2016-07-31 MED ORDER — AMLODIPINE BESYLATE 10 MG PO TABS: 10.0000 mg | ORAL_TABLET | Freq: Every day | ORAL | Status: DC

## 2016-07-31 MED ORDER — LEVOFLOXACIN 500 MG PO TABS
500.0000 mg | ORAL_TABLET | Freq: Every day | ORAL | Status: DC
  Administered 2016-07-31: 500 mg via ORAL
  Filled 2016-07-31: qty 1

## 2016-07-31 MED ORDER — AMLODIPINE BESYLATE 5 MG PO TABS
5.0000 mg | ORAL_TABLET | Freq: Every day | ORAL | Status: DC
  Administered 2016-07-31: 5 mg via ORAL
  Filled 2016-07-31: qty 1

## 2016-07-31 NOTE — Progress Notes (Signed)
PT Cancellation Note  Patient Details Name: Warren GardenerMilton Ortiz MRN: [id number] DOB: 12-06-1950   Cancelled Treatment:    Reason Eval/Treat Not Completed: Medical issues which prohibited therapy   BP remains high (currently 193/147) with pt emotional (crying and upset he can't go home). RN made aware.   Bernis Schreur 07/31/2016, 3:04 PM  Pager [phone]

## 2016-07-31 NOTE — Progress Notes (Signed)
[facility] Spoke w joey and laura. They req I fax walker order to pt's dr dr Tristan Schroederstokes fax [phone]. [facility] will order walker for pt.

## 2016-07-31 NOTE — Progress Notes (Signed)
Pt states that he is "leaving against medical advice and that his blood pressure has never kept him in the hospital before." Pt also states that he has his blood pressure medication in his car which is at this daughters house." Pt was advised that it is life threatening to leave at this time due to his hypertension. Pt was educated that hypertension can lead to stroke. Pt expressed verbal understanding about hypertension and his high stroke risk.  MD notified. Pt signed AMA form and has left unit with out assistance from 2 heart staff.

## 2016-07-31 NOTE — Progress Notes (Signed)
PROGRESS NOTE    Warren Ortiz Ruberg  WUJ:[id number] DOB: [birth year] DOA: 07/28/2016 PCP: No PCP Per Patient   Brief Narrative:  Warren Ortiz Hoadley is a 65 y.o. male with a medical history significant for hypertension. Patient has a long history of smoking, apparently he carries a diagnosis of COPD. He was in the emergency department 07/09/16 for difficulty breathing, presumably for COPD exacerbation. Patient received prescriptions for prednisone, Zithromax, and albuterol inhaler. Patient states he completed the 5 days of prednisone and Zithromax and did feel that symptoms improved. Several days later however patient began having recurrent shortness of breath and a cough productive of yellow phlegm. Patient brought back to ED this morning by EMS. He was somnolent on arrival No fevers at home. No chest pain. Patient is urinating, he tries to watch his sodium intake closely. Patient complains of bilateral lower extremity swelling which started about one year ago. [facility]. No orthopnea. Patient does not want steroids as they cause weight gain. He has successfully lost weight through exercise and diet .    Assessment & Plan:   Principal Problem:   Acute respiratory failure with hypercapnia (HCC) Active Problems:   COPD exacerbation (HCC)   Acute hypercapnic respiratory failure (HCC)   Methadone use (HCC)   Hypertension   AKI (acute kidney injury) (HCC)   Tobacco abuse   Bilateral lower extremity edema   Alcohol withdrawal (HCC)  #1 acute respiratory failure with hypercapnia likely secondary to COPD exacerbation +/- RHF Patient presenting with acute respiratory failure with hypercapnia felt to be likely secondary to a COPD exacerbation +/- RHF. Patient was treated in the outpatient setting for COPD approximately 3 weeks prior to admission with Zithromax and prednisone with temporary improvement however worsening symptoms requiring admission.  Concern for component of heart failure due to lower extremity edema. Patient with clinical improvement. Patient off BiPAP. Change IV Solu-Medrol to oral prednisone 60 mg daily. Continue oral Levaquin. Continue Mucinex. Continue scheduled nebulizers, Flonase, Claritin, Pulmicort, PPI, flutter valve. Change IV Lasix to oral Lasix. Follow.  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 Patient states worsening lower extremity edema since October after his Lasix were discontinued. Differential includes cardiac versus renal etiology versus liver. Patient with slightly elevated LFTs on admission. Repeat LFTs pending. UA with 100 protein. Patient was not on a calcium channel blocker prior to admission. 2-D echo with EF of 60-65% with grade 1 diastolic dysfunction and poor acoustic windows.  Patient may have a component of right heart failure may be secondary to worsening COPD. Patient is -1.4 L over the past 24 hours. Patient is -2.95 L during this hospitalization. Change Lasix to 40 mg daily. Strict I's and O's. Daily weights. Outpatient follow-up. Follow.  #3 hypertension Patient on IV Lasix. Increased lisinopril to 40 mg daily. Lasix 40 mg daily. Will start Norvasc 5 mg daily for better blood pressure control.  #4 tobacco abuse Tobacco cessation.  #5 acute kidney injury Improved with diuresis. UA with negative nitrite, negative leukocytes, 100 protein.  Monitor renal function with initiation of ACE inhibitor. Follow.  #6 obstructive sleep apnea Could not tolerate C Pap.  #7 chronic methadone use Patient agitated/angry about reversal of his methadone with Narcan in the ED. Patient currently on home regimen of methadone. Monitor for sedation.  #8 EKG changes Concern for ST elevation in V3. EDP reviewed with Dr. Excell Seltzerooper of cardiology. Admitting physician recommendations were to trend troponins  and check a 2-D echo. Patient asymptomatic. Troponin is minimally  elevated and plateaued. 2-D echo with a EF of 60-65% with grade 1 diastolic dysfunction. Poor acoustic windows. Outpatient follow-up  #9 ETOH withdrawal Improved with stepdown Ativan withdrawal protocol. Alcohol cessation.  #10 leukocytosis Likely secondary to steroids. Trending down with steroid taper.    DVT prophylaxis: Lovenox Code Status: Full Family Communication: Updated patient. No family at bedside. Disposition Plan: Home once blood pressure controlled and improved and medically stable.   Consultants:   None  Procedures:   Chest x-ray 07/28/2016  2-D echo 07/30/2016. EF 60-65%. Grade 1 diastolic dysfunction. Poor acoustic windows.  Antimicrobials:   IV Levaquin 07/28/2016>>>>oral levaquin 07/30/2016   Subjective: Patient states feeling better. Tremors improved from yesterday. No chest pain. No shortness of breath. Asking when he can go home. Sitting up in chair eating breakfast. Patient stated he usually takes his methadone 5 AM at home and hasn't gotten it yet.  Objective: Vitals:   07/31/16 0632 07/31/16 0822 07/31/16 0908 07/31/16 0915  BP:  (!) 170/108 (!) 181/99   Pulse:  (!) 101    Resp:  18    Temp:      TempSrc:      SpO2:  (!) 56%  96%  Weight: 113.4 kg (249 lb 14.4 oz)     Height:        Intake/Output Summary (Last 24 hours) at 07/31/16 0945 Last data filed at 07/31/16 4540  Gross per 24 hour  Intake              690 ml  Output             1850 ml  Net            -1160 ml   Filed Weights   07/29/16 0430 07/30/16 0417 07/31/16 9811  Weight: 114.4 kg (252 lb 4.8 oz) 115.1 kg (253 lb 11.2 oz) 113.4 kg (249 lb 14.4 oz)    Examination:  General exam: Sitting up in chair. No tremors noted. Respiratory system: Poor to fair air movement. No crackles noted. No wheezing. Respiratory effort normal. Cardiovascular system: S1 & S2 heard, RRR. No JVD, murmurs, rubs, gallops or clicks. 2+ bilateral lower extremity edema. Gastrointestinal system:  Abdomen is nondistended, obese, soft and nontender. No organomegaly or masses felt. Normal bowel sounds heard. Central nervous system: Alert and oriented. No focal neurological deficits. Extremities: Symmetric 5 x 5 power. Skin: No rashes, lesions or ulcers Psychiatry: Judgement and insight appear fair. Mood & affect appropriate.     Data Reviewed: I have personally reviewed following labs and imaging studies  CBC:  Recent Labs Lab 07/28/16 0953 07/28/16 1003 07/29/16 0256 07/30/16 0439 07/31/16 0510  WBC 7.2  --  6.9 15.0* 14.5*  NEUTROABS 5.2  --   --   --   --   HGB 15.5 16.0 17.1* 17.3* 18.0*  HCT 45.4 47.0 50.5 50.5 53.5*  MCV 102.7*  --  100.6* 100.2* 101.7*  PLT 153  --  167 163 182   Basic Metabolic Panel:  Recent Labs Lab 07/28/16 0953 07/28/16 1003 07/29/16 0256 07/29/16 1002 07/30/16 1145 07/31/16 0510  NA 138 138 136  --  137 136  K 4.1 3.9 4.0  --  4.2 4.1  CL 104 103 101  --  98* 99*  CO2 26  --  26  --  28 27  GLUCOSE 109* 110* 115*  --  137* 126*  BUN 19 23*  19  --  31* 36*  CREATININE 1.29* 1.30* 1.16  --  1.53* 1.35*  CALCIUM 8.5*  --  8.4*  --  8.8* 9.1  MG  --   --   --  1.9  --  2.1   GFR: Estimated Creatinine Clearance: 65.2 mL/min (by C-G formula based on SCr of 1.35 mg/dL (H)). Liver Function Tests:  Recent Labs Lab 07/28/16 0953 07/30/16 1145 07/31/16 0510  AST 62* 45* 55*  ALT 71* 66* 73*  ALKPHOS 37* 36* 43  BILITOT 0.7 0.4 0.5  PROT 6.2* 7.3 6.9  ALBUMIN 2.5* 3.2* 3.2*   No results for input(s): LIPASE, AMYLASE in the last 168 hours. No results for input(s): AMMONIA in the last 168 hours. Coagulation Profile: No results for input(s): INR, PROTIME in the last 168 hours. Cardiac Enzymes:  Recent Labs Lab 07/28/16 1405 07/28/16 1940 07/28/16 2256  TROPONINI 0.03* 0.03* <0.03   BNP (last 3 results) No results for input(s): PROBNP in the last 8760 hours. HbA1C: No results for input(s): HGBA1C in the last 72  hours. CBG:  Recent Labs Lab 07/28/16 0919  GLUCAP 125*   Lipid Profile: No results for input(s): CHOL, HDL, LDLCALC, TRIG, CHOLHDL, LDLDIRECT in the last 72 hours. Thyroid Function Tests: No results for input(s): TSH, T4TOTAL, FREET4, T3FREE, THYROIDAB in the last 72 hours. Anemia Panel: No results for input(s): VITAMINB12, FOLATE, FERRITIN, TIBC, IRON, RETICCTPCT in the last 72 hours. Sepsis Labs: No results for input(s): PROCALCITON, LATICACIDVEN in the last 168 hours.  Recent Results (from the past 240 hour(s))  MRSA PCR Screening     Status: None   Collection Time: 07/28/16  1:51 PM  Result Value Ref Range Status   MRSA by PCR NEGATIVE NEGATIVE Final    Comment:        The GeneXpert MRSA Assay (FDA approved for NASAL specimens only), is one component of a comprehensive MRSA colonization surveillance program. It is not intended to diagnose MRSA infection nor to guide or monitor treatment for MRSA infections.   Urine culture     Status: None   Collection Time: 07/29/16 12:02 PM  Result Value Ref Range Status   Specimen Description URINE, RANDOM  Final   Special Requests NONE  Final   Culture NO GROWTH  Final   Report Status 07/30/2016 FINAL  Final         Radiology Studies: No results found.      Scheduled Meds: . amLODipine  5 mg Oral Daily  . aspirin  324 mg Oral Once  . budesonide (PULMICORT) nebulizer solution  0.25 mg Nebulization BID  . enoxaparin (LOVENOX) injection  40 mg Subcutaneous Q24H  . fentaNYL (SUBLIMAZE) injection  100 mcg Intravenous Once  . fluticasone  2 spray Each Nare Daily  . folic acid  1 mg Oral Daily  . furosemide  40 mg Oral Daily  . guaiFENesin  1,200 mg Oral BID  . ipratropium-albuterol  3 mL Nebulization Once  . ipratropium-albuterol  3 mL Nebulization BID  . levofloxacin  500 mg Oral Daily  . lisinopril  40 mg Oral Daily  . loratadine  10 mg Oral Daily  . methadone  80 mg Oral Daily  . methylPREDNISolone  (SOLU-MEDROL) injection  60 mg Intravenous Q12H  . multivitamin with minerals  1 tablet Oral Daily  . pantoprazole  40 mg Oral Q0600  . sodium chloride flush  3 mL Intravenous Q12H  . thiamine  100 mg Oral Daily  Continuous Infusions:    LOS: 3 days    Time spent: 35 minutes    Ziasia Lenoir, MD [facility] Pager [phone]  If 7PM-7AM, please contact night-coverage [email or web address] Password [facility] 07/31/2016, 9:45 AM

## 2016-07-31 NOTE — Progress Notes (Signed)
Pt. BP continues to exceed 180 SBP. Md notified, orders acknowledged. Will continue to monitor.

## 2016-08-07 ENCOUNTER — Encounter (HOSPITAL_COMMUNITY): Payer: Self-pay | Admitting: Emergency Medicine

## 2016-08-07 ENCOUNTER — Emergency Department (HOSPITAL_COMMUNITY)
Admission: EM | Admit: 2016-08-07 | Discharge: 2016-08-07 | Disposition: A | Discharge status: Home / Self Care | Payer: Non-veteran care | Attending: Emergency Medicine | Admitting: Emergency Medicine

## 2016-08-07 ENCOUNTER — Emergency Department (HOSPITAL_COMMUNITY): Payer: Non-veteran care

## 2016-08-07 DIAGNOSIS — F17210 Nicotine dependence, cigarettes, uncomplicated: Secondary | ICD-10-CM | POA: Insufficient documentation

## 2016-08-07 DIAGNOSIS — I160 Hypertensive urgency: Secondary | ICD-10-CM | POA: Diagnosis not present

## 2016-08-07 DIAGNOSIS — R51 Headache: Secondary | ICD-10-CM | POA: Diagnosis present

## 2016-08-07 DIAGNOSIS — J449 Chronic obstructive pulmonary disease, unspecified: Secondary | ICD-10-CM | POA: Insufficient documentation

## 2016-08-07 DIAGNOSIS — R0600 Dyspnea, unspecified: Secondary | ICD-10-CM | POA: Diagnosis not present

## 2016-08-07 LAB — CBC WITH DIFFERENTIAL/PLATELET
BASOS ABS: 0 10*3/uL (ref 0.0–0.1)
BASOS PCT: 0 %
EOS ABS: 0.1 10*3/uL (ref 0.0–0.7)
Eosinophils Relative: 1 %
HCT: 51.8 % (ref 39.0–52.0)
HEMOGLOBIN: 17.4 g/dL — AB (ref 13.0–17.0)
LYMPHS ABS: 1.9 10*3/uL (ref 0.7–4.0)
Lymphocytes Relative: 19 %
MCH: 33.9 pg (ref 26.0–34.0)
MCHC: 33.6 g/dL (ref 30.0–36.0)
MCV: 101 fL — ABNORMAL HIGH (ref 78.0–100.0)
Monocytes Absolute: 1 10*3/uL (ref 0.1–1.0)
Monocytes Relative: 10 %
NEUTROS PCT: 70 %
Neutro Abs: 7 10*3/uL (ref 1.7–7.7)
Platelets: 190 10*3/uL (ref 150–400)
RBC: 5.13 MIL/uL (ref 4.22–5.81)
RDW: 13.6 % (ref 11.5–15.5)
WBC: 10 10*3/uL (ref 4.0–10.5)

## 2016-08-07 LAB — BASIC METABOLIC PANEL
ANION GAP: 9 (ref 5–15)
BUN: 15 mg/dL (ref 6–20)
CO2: 26 mmol/L (ref 22–32)
Calcium: 9.2 mg/dL (ref 8.9–10.3)
Chloride: 101 mmol/L (ref 101–111)
Creatinine, Ser: 1.2 mg/dL (ref 0.61–1.24)
Glucose, Bld: 86 mg/dL (ref 65–99)
POTASSIUM: 4 mmol/L (ref 3.5–5.1)
SODIUM: 136 mmol/L (ref 135–145)

## 2016-08-07 LAB — MAGNESIUM: MAGNESIUM: 1.7 mg/dL (ref 1.7–2.4)

## 2016-08-07 LAB — BRAIN NATRIURETIC PEPTIDE: B NATRIURETIC PEPTIDE 5: 62 pg/mL (ref 0.0–100.0)

## 2016-08-07 MED ORDER — HYDRALAZINE HCL 20 MG/ML IJ SOLN
20.0000 mg | Freq: Once | INTRAMUSCULAR | Status: AC
  Administered 2016-08-07: 20 mg via INTRAVENOUS
  Filled 2016-08-07: qty 1

## 2016-08-07 MED ORDER — DIPHENHYDRAMINE HCL 25 MG PO CAPS: 25.0000 mg | ORAL_CAPSULE | Freq: Once | ORAL | Status: DC

## 2016-08-07 MED ORDER — METOCLOPRAMIDE HCL 10 MG PO TABS: 10.0000 mg | ORAL_TABLET | Freq: Once | ORAL | Status: DC

## 2016-08-07 MED ORDER — HYDROCHLOROTHIAZIDE 25 MG PO TABS
25.0000 mg | ORAL_TABLET | Freq: Every day | ORAL | Status: DC
  Administered 2016-08-07: 25 mg via ORAL
  Filled 2016-08-07: qty 1

## 2016-08-07 MED ORDER — LISINOPRIL 10 MG PO TABS: 10.0000 mg | ORAL_TABLET | Freq: Every day | ORAL | 0 refills | Status: AC

## 2016-08-07 MED ORDER — LORAZEPAM 2 MG/ML IJ SOLN
1.0000 mg | Freq: Once | INTRAMUSCULAR | Status: AC
  Administered 2016-08-07: 1 mg via INTRAVENOUS
  Filled 2016-08-07: qty 1

## 2016-08-07 MED ORDER — METOCLOPRAMIDE HCL 5 MG/ML IJ SOLN
10.0000 mg | Freq: Once | INTRAMUSCULAR | Status: AC
  Administered 2016-08-07: 10 mg via INTRAVENOUS
  Filled 2016-08-07: qty 2

## 2016-08-07 MED ORDER — METOPROLOL TARTRATE 5 MG/5ML IV SOLN
10.0000 mg | Freq: Once | INTRAVENOUS | Status: AC
  Administered 2016-08-07: 10 mg via INTRAVENOUS
  Filled 2016-08-07: qty 10

## 2016-08-07 MED ORDER — CLONIDINE HCL 0.2 MG PO TABS: 0.2000 mg | ORAL_TABLET | Freq: Two times a day (BID) | ORAL | 0 refills | Status: AC

## 2016-08-07 MED ORDER — FUROSEMIDE 20 MG PO TABS
10.0000 mg | ORAL_TABLET | Freq: Once | ORAL | Status: AC
  Administered 2016-08-07: 10 mg via ORAL
  Filled 2016-08-07: qty 1

## 2016-08-07 MED ORDER — HYDROCHLOROTHIAZIDE 25 MG PO TABS: 25.0000 mg | ORAL_TABLET | Freq: Every day | ORAL | 0 refills | Status: AC

## 2016-08-07 MED ORDER — LISINOPRIL 10 MG PO TABS
10.0000 mg | ORAL_TABLET | Freq: Once | ORAL | Status: AC
  Administered 2016-08-07: 10 mg via ORAL
  Filled 2016-08-07: qty 1

## 2016-08-07 MED ORDER — DIPHENHYDRAMINE HCL 50 MG/ML IJ SOLN
25.0000 mg | Freq: Once | INTRAMUSCULAR | Status: AC
  Administered 2016-08-07: 25 mg via INTRAVENOUS
  Filled 2016-08-07: qty 1

## 2016-08-07 MED ORDER — FUROSEMIDE 20 MG PO TABS: 20.0000 mg | ORAL_TABLET | Freq: Every day | ORAL | 0 refills | Status: AC

## 2016-08-07 MED ORDER — CLONIDINE HCL 0.2 MG PO TABS
0.2000 mg | ORAL_TABLET | Freq: Once | ORAL | Status: AC
  Administered 2016-08-07: 0.2 mg via ORAL
  Filled 2016-08-07: qty 1

## 2016-08-07 NOTE — ED Provider Notes (Signed)
[facility]-EMERGENCY DEPT Provider Note   CSN: [id number] Arrival date & time: 08/07/16  1553   History   Chief Complaint Chief Complaint  Patient presents with  . Hypertension  . Headache    HPI Warren Ortiz is a 65 y.o. male.  The history is provided by the patient.  Headache   This is a new problem. The current episode started 2 days ago. The problem occurs constantly. The problem has not changed since onset.Associated with: hypertension, out of meds x 5 days. The pain is located in the frontal (moreso global) region. The quality of the pain is described as dull. The pain is moderate. The pain does not radiate. Pertinent negatives include no anorexia, no fever, no malaise/fatigue, no chest pressure, no near-syncope, no palpitations, no nausea and no vomiting.    Past Medical History:  Diagnosis Date  . Arthritis   . COPD (chronic obstructive pulmonary disease) (HCC) 07/28/2016   per patient  . Hypertension     Patient Active Problem List   Diagnosis Date Noted  . Leukocytosis   . RHF (right heart failure)   . Alcohol withdrawal (HCC)   . Bilateral lower extremity edema   . Acute hypercapnic respiratory failure (HCC) 07/28/2016  . Methadone use (HCC) 07/28/2016  . Hypertension 07/28/2016  . AKI (acute kidney injury) (HCC) 07/28/2016  . COPD exacerbation (HCC) 07/28/2016  . Tobacco abuse 07/28/2016  . Acute respiratory failure with hypercapnia (HCC) 07/28/2016    Past Surgical History:  Procedure Laterality Date  . TONSILLECTOMY         Home Medications    Prior to Admission medications   Medication Sig Start Date End Date Taking? Authorizing Provider  methadone (DOLOPHINE) 10 MG/ML solution Take 80 mg by mouth daily.   Yes Historical Provider, MD  albuterol (PROVENTIL HFA;VENTOLIN HFA) 108 (90 Base) MCG/ACT inhaler Inhale 1-2 puffs into the lungs every 6 (six) hours as needed for wheezing or shortness of breath. Patient not taking: Reported on 08/07/2016 07/09/16    Maia Plan, MD  cloNIDine (CATAPRES) 0.2 MG tablet Take 1 tablet (0.2 mg total) by mouth 2 (two) times daily. 08/07/16 08/14/16  Horald Pollen, MD  furosemide (LASIX) 20 MG tablet Take 1 tablet (20 mg total) by mouth daily. 08/07/16   Horald Pollen, MD  hydrochlorothiazide (HYDRODIURIL) 25 MG tablet Take 1 tablet (25 mg total) by mouth daily. 08/07/16   Horald Pollen, MD  lisinopril (PRINIVIL,ZESTRIL) 10 MG tablet Take 1 tablet (10 mg total) by mouth daily. 08/07/16   Horald Pollen, MD    Family History Family History  Problem Relation Age of Onset  . Cancer Mother     patient thinks bladder or colon  . Diabetes Sister     Social History Social History  Substance Use Topics  . Smoking status: Current Every Day Smoker    Packs/day: 1.00    Types: Cigarettes  . Smokeless tobacco: Never Used  . Alcohol use Yes     Comment: occasionally     Allergies   Review of patient's allergies indicates no known allergies.   Review of Systems Review of Systems  Constitutional: Negative for fever and malaise/fatigue.  HENT: Negative for congestion.   Eyes: Negative for visual disturbance.  Respiratory: Negative for chest tightness.        Pt reports his baseline DOE and orthopnea, not worsening since out of BP meds  Cardiovascular: Positive for leg swelling. Negative for chest pain, palpitations and near-syncope.  Chronic b/l LE edema, not worse  Gastrointestinal: Negative for anorexia, nausea and vomiting.  Musculoskeletal: Negative for back pain and neck pain.  Skin: Negative for rash.  Neurological: Positive for headaches. Negative for dizziness, facial asymmetry, speech difficulty, weakness and numbness.  Psychiatric/Behavioral: Negative for confusion.     Physical Exam Updated Vital Signs BP (!) 130/116   Pulse 75   Temp 98.3 F (36.8 C) (Oral)   Resp 20   Ht 5\' 5"  (1.651 m)   Wt 113.4 kg   SpO2 93%   BMI 41.60 kg/m   Physical Exam  Constitutional: He is  oriented to person, place, and time. He appears well-developed and well-nourished. No distress.  Cooperative, morbidly obese, appears uncomfortable sitting on edge of bed, but no acute distress  HENT:  Head: Normocephalic and atraumatic.  Eyes: Conjunctivae and EOM are normal. Pupils are equal, round, and reactive to light. No scleral icterus.  Neck: Normal range of motion. Neck supple. No JVD present.  Cardiovascular: Normal rate and regular rhythm.   Pulmonary/Chest: Effort normal and breath sounds normal. No respiratory distress.  Abdominal: Soft. There is no tenderness.  Musculoskeletal: He exhibits edema. He exhibits no tenderness.  Symmetric non-pitting edema of b/l LE's  Neurological: He is alert and oriented to person, place, and time. No cranial nerve deficit. He exhibits normal muscle tone. Coordination normal.  Symmetric strength of b/l Ue's and b/l Le's. Normal gait. Normal speech. Intact sensation  Skin: Skin is warm and dry. He is not diaphoretic.  Psychiatric: He has a normal mood and affect.  Nursing note and vitals reviewed.    ED Treatments / Results  Labs (all labs ordered are listed, but only abnormal results are displayed) Labs Reviewed  CBC WITH DIFFERENTIAL/PLATELET - Abnormal; Notable for the following:       Result Value   Hemoglobin 17.4 (*)    MCV 101.0 (*)    All other components within normal limits  BRAIN NATRIURETIC PEPTIDE  BASIC METABOLIC PANEL  MAGNESIUM    EKG  EKG Interpretation  Date/Time:  Tuesday August 07 2016 16:58:51 EDT Ventricular Rate:  77 PR Interval:    QRS Duration: 94 QT Interval:  391 QTC Calculation: 443 R Axis:   23 Text Interpretation:  Sinus rhythm No significant change since last tracing Confirmed by RAY MD, Duwayne HeckANIELLE [phone]([id number]) on 08/07/2016 7:47:58 PM       Radiology Dg Chest 2 View  Result Date: 08/07/2016 CLINICAL DATA:  Dyspnea for 1 day, rule out CHF EXAM: CHEST  2 VIEW COMPARISON:  07/28/2016 chest radiograph  FINDINGS: The heart is borderline enlarged. The thoracic aorta with slight uncoiling. No aneurysm. Right basilar atelectasis and/or scarring. Mild interstitial edema. No pneumonic consolidation. No effusion. IMPRESSION: Mild interstitial pulmonary edema. Electronically Signed   By: Tollie Ethavid  Kwon M.D.   On: 08/07/2016 18:31    Procedures Procedures (including critical care time)  Medications Ordered in ED Medications  lisinopril (PRINIVIL,ZESTRIL) tablet 10 mg (10 mg Oral Given 08/07/16 1704)  furosemide (LASIX) tablet 10 mg (10 mg Oral Given 08/07/16 1704)  cloNIDine (CATAPRES) tablet 0.2 mg (0.2 mg Oral Given 08/07/16 1703)  metoCLOPramide (REGLAN) injection 10 mg (10 mg Intravenous Given 08/07/16 1757)  diphenhydrAMINE (BENADRYL) injection 25 mg (25 mg Intravenous Given 08/07/16 1757)  hydrALAZINE (APRESOLINE) injection 20 mg (20 mg Intravenous Given 08/07/16 1856)  LORazepam (ATIVAN) injection 1 mg (1 mg Intravenous Given 08/07/16 2003)  metoprolol (LOPRESSOR) injection 10 mg (10 mg Intravenous Given 08/07/16 2004)  Initial Impression / Assessment and Plan / ED Course  I have reviewed the triage vital signs and the nursing notes.  Pertinent labs & imaging results that were available during my care of the patient were reviewed by me and considered in my medical decision making (see chart for details).  Clinical Course   Warren Ortiz is a 65 y.o. male with h/o severe HTN on numerous home meds, including clonidine, lasix, HCTZ, lisinopril, and "one or two more I don't remember the names" who presented to ED for evaluation of 2-3 days of global headache associated with HTN 200's/100's since running out of his BP medications 5 days ago. Pt states he plans on going to the [place] tomorrow to pick up his refills but does not yet have an appointment. Denies chest pain, shortness of breath beyond baseline, or visual changes. No neurologic deficits. Given migraine cocktail of reglan and benadryl with  resolution of migraine, given antihypertensives as above with mild improvement in Bp. No concerning lab changes. CXR with mild edema. Advised to take Rx as prescribed, [facility] Rx 1 week supply of meds below in case unable to [facility]. Advised to return to ER for chest pain, shortness of breath, numbness/weakness of arms/legs, visual changes, or any other new, worse, or concerning symptoms. He demonstrates understanding of this and comfort and desire for d/c home.  Pt condition, course, and discharge were discussed with attending physician Dr. Margarita Grizzle.  Final Clinical Impressions(s) / ED Diagnoses   Final diagnoses:  Hypertensive urgency    New Prescriptions Discharge Medication List as of 08/07/2016  8:56 PM    START taking these medications   Details  cloNIDine (CATAPRES) 0.2 MG tablet Take 1 tablet (0.2 mg total) by mouth 2 (two) times daily., Starting Tue 08/07/2016, Until Tue 08/14/2016, Print    furosemide (LASIX) 20 MG tablet Take 1 tablet (20 mg total) by mouth daily., Starting Tue 08/07/2016, Print    hydrochlorothiazide (HYDRODIURIL) 25 MG tablet Take 1 tablet (25 mg total) by mouth daily., Starting Tue 08/07/2016, Print    lisinopril (PRINIVIL,ZESTRIL) 10 MG tablet Take 1 tablet (10 mg total) by mouth daily., Starting Tue 08/07/2016, Print         Horald Pollen, MD 08/09/16 1610    Margarita Grizzle, MD 08/09/16 [id number]

## 2016-08-07 NOTE — ED Notes (Signed)
As this tech passed pt room with UA sample in hand, pt motioned tech into room to lower bed rail. This tech explained to pt that she had to go take off gloves and cleanse hands before entering the room. Pt became aggravated and slid to end of bed on own. Once hands were cleansed this tech went back to room and lowered bed rail per pt request.

## 2016-08-07 NOTE — ED Notes (Signed)
Pt. Sitting on side of bed. I offered to help him lay in the bed he refused.

## 2016-08-07 NOTE — ED Triage Notes (Signed)
Pt has been out of his BP meds for one week. Pt states he has a headache today from his BP being high. Pt states he feels a little lightheaded at times as well.

## 2016-08-07 NOTE — ED Notes (Signed)
Patient transported to X-ray 

## 2016-08-07 NOTE — ED Notes (Signed)
Attempted to start IV looked with hot pack and not seeing anything. Pt reports he is difficult stick and requesting IV team. IV team contacted.

## 2016-08-27 NOTE — Discharge Summary (Signed)
Physician Discharge Summary  Warren GardenerMilton Burnley Ortiz:[id number]:[id number] DOB: [birth year] DOA: 07/28/2016  PCP: No PCP Per Patient  Admit date: 07/28/2016 Discharge date: 07/31/2016     Patient left AGAINST MEDICAL ADVICE Time spent: 60 minutes  Recommendations for Outpatient Follow-up:  1. Patient left AGAINST MEDICAL ADVICE. Hopefully he'll follow-up with his PCP.   Discharge Diagnoses:  Principal Problem:   Acute respiratory failure with hypercapnia (HCC) Active Problems:   COPD exacerbation (HCC)   Acute hypercapnic respiratory failure (HCC)   Methadone use (HCC)   Hypertension   AKI (acute kidney injury) (HCC)   Tobacco abuse   Bilateral lower extremity edema   Alcohol withdrawal (HCC)   Leukocytosis   RHF (right heart failure)   Discharge Condition: Patient left AGAINST MEDICAL ADVICE  Diet recommendation: Patient left AGAINST MEDICAL ADVICE  Filed Weights   07/29/16 0430 07/30/16 0417 07/31/16 [id number]  Weight: 114.4 kg (252 lb 4.8 oz) 115.1 kg (253 lb 11.2 oz) 113.4 kg (249 lb 14.4 oz)    History of present illness:  Per Dr Staci RighterStinson [name] is a 65 y.o. male with a medical history significant for hypertension. Patient has a long history of smoking, apparently he carries a diagnosis of COPD. He was in the emergency department 07/09/16 for difficulty breathing, presumably for COPD exacerbation. Patient received prescriptions for prednisone, Zithromax, and albuterol inhaler. Patient stated he completed the 5 days of prednisone and Zithromax and did feel that symptoms improved. Several days later however patient began having recurrent shortness of breath and a cough productive of yellow phlegm. Patient brought back to ED the morning of admission, by EMS. He was somnolent on arrival No fevers at home. No chest pain. Patient is urinating, he tries to watch his sodium intake closely. Patient complains of bilateral lower extremity swelling which started about one year ago. [facility]. No orthopnea. Patient did not want steroids as they cause weight gain. He had successfully lost weight through exercise and diet .   ED Course:  Afebrile, bradycardic in low fifties, respirations 13-19, BP elevated at 178/91. O2 saturation 95% on nasal cannula Creatinine 1.3, ionized calcium 1.0, BNP 59, POC troponin 0.04 WBC 7.2, hemoglobin 16 cxr-  atelectasis and chronic bronchitic change Blood gases- arterial pH 7.287, PCO2 56, PO2 85, bicarbonate 27 ECG Sinus rhythm Prolonged PR interval Probable lateral infarct, age indeterminate V3 ST elevation, no reciprocal change, dw [facility]  [facility] Course:  #1 acute respiratory failure with hypercapnia likely secondary to COPD exacerbation +/- RHF Patient presenting with acute respiratory failure with hypercapnia felt to be likely secondary to a COPD exacerbation +/- RHF. Patient was treated in the outpatient setting for COPD approximately 3 weeks prior to admission with Zithromax and prednisone with temporary improvement however worsening symptoms requiring admission. Concern for component of heart failure due to lower extremity edema. Patient was placed on IV Lasix and initially was on the BiPAP which was subsequently transitioned off of. Patient was also placed on IV Solu-Medrol which was transitioned to oral prednisone as well as Levaquin, Mucinex, scheduled nebulizers, Flonase Claritin Pulmicort PPI flutter valve. As patient was slowly improving patient was insistent on leaving AGAINST MEDICAL ADVICE which he did.   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 Patient stated worsening lower extremity edema since October after his Lasix was discontinued. Differential includes cardiac versus renal etiology versus liver. Patient with slightly elevated LFTs on admission. Repeat  LFTs pending. UA with 100 protein. Patient was not on a calcium channel  blocker prior to admission. 2-D echo with EF of 60-65% with grade 1 diastolic dysfunction and poor acoustic windows.  Patient may have a component of right heart failure may be secondary to worsening COPD. Patient was placed on IV Lasix and had been -2.95 L throughout the hospitalization when he left AGAINST MEDICAL ADVICE.   #3 hypertension Patient during the hospitalization was placed on IV Lasix for diureses secondary to probable diastolic right heart failure. Patient was subsequently placed on lisinopril and dose increased to 40 mg daily. Norvasc 5 mg daily was started for blood pressure control. As patient's blood pressure was being monitored patient subsequently left AGAINST MEDICAL ADVICE although it was noted that his blood pressure was 193/147. Medication changes were being made and in process for better blood pressure control, when patient left AGAINST MEDICAL ADVICE.   #4 tobacco abuse Tobacco cessation.  #5 acute kidney injury Improved with diuresis. UA with negative nitrite, negative leukocytes, 100 protein. Patient was started on ACE inhibitor and renal function was to be monitored closely however patient left AGAINST MEDICAL ADVICE.    #6 obstructive sleep apnea Could not tolerate CPAP, and as such refused this during the hospitalization.  #7 chronic methadone use Patient agitated/angry about reversal of his methadone with Narcan in the ED. Patient was placed on his home regimen of methadone and monitored closely.. Patient left AGAINST MEDICAL ADVICE.   #8 EKG changes Concern for ST elevation in V3. EDP reviewed with Dr. Excell Seltzer of cardiology. Admitting physician recommendations were to trend troponins and check a 2-D echo. Patient remained asymptomatic. Troponin was minimally elevated and plateaued. 2-D echo with a EF of 60-65% with grade 1 diastolic dysfunction. Poor acoustic windows. Outpatient follow-up  #9 ETOH withdrawal Patient was maintained on the stepdown Ativan  withdrawal protocol. Alcohol cessation was stressed to patient. Patient was still on the Ativan withdrawal protocol when he left AGAINST MEDICAL ADVICE.   #10 leukocytosis Likely secondary to steroids. Trended down with steroid taper.   Procedures:  Chest x-ray 07/28/2016  2-D echo 07/30/2016. EF 60-65%. Grade 1 diastolic dysfunction. Poor acoustic windows.   Consultations:  None  Discharge Exam: Vitals:   07/31/16 1600 07/31/16 1615  BP: (!) 165/105   Pulse: 99   Resp: 17   Temp:  97.5 F (36.4 C)    General: NAD Cardiovascular: RRR Respiratory: Poor -fair air movement.  Discharge Instructions    Discharge Medication List as of 07/31/2016 10:28 PM    CONTINUE these medications which have NOT CHANGED   Details  albuterol (PROVENTIL HFA;VENTOLIN HFA) 108 (90 Base) MCG/ACT inhaler Inhale 1-2 puffs into the lungs every 6 (six) hours as needed for wheezing or shortness of breath., Starting Mon 07/09/2016, Print    methadone (DOLOPHINE) 10 MG/ML solution Take 80 mg by mouth daily., Historical Med       No Known Allergies    The results of significant diagnostics from this hospitalization (including imaging, microbiology, ancillary and laboratory) are listed below for reference.    Significant Diagnostic Studies: Dg Chest 2 View  Result Date: 08/07/2016 CLINICAL DATA:  Dyspnea for 1 day, rule out CHF EXAM: CHEST  2 VIEW COMPARISON:  07/28/2016 chest radiograph FINDINGS: The heart is borderline enlarged. The thoracic aorta with slight uncoiling. No aneurysm. Right basilar atelectasis and/or scarring. Mild interstitial edema. No pneumonic consolidation. No effusion. IMPRESSION: Mild interstitial pulmonary edema. Electronically Signed   By: Onalee Hua  Sterling Big M.D.   On: 08/07/2016 18:31    Microbiology: No results found for this or any previous visit (from the past 240 hour(s)).   Labs: Basic Metabolic Panel: No results for input(s): NA, K, CL, CO2, GLUCOSE, BUN,  CREATININE, CALCIUM, MG, PHOS in the last 168 hours. Liver Function Tests: No results for input(s): AST, ALT, ALKPHOS, BILITOT, PROT, ALBUMIN in the last 168 hours. No results for input(s): LIPASE, AMYLASE in the last 168 hours. No results for input(s): AMMONIA in the last 168 hours. CBC: No results for input(s): WBC, NEUTROABS, HGB, HCT, MCV, PLT in the last 168 hours. Cardiac Enzymes: No results for input(s): CKTOTAL, CKMB, CKMBINDEX, TROPONINI in the last 168 hours. BNP: BNP (last 3 results)  Recent Labs  07/28/16 0953 08/07/16 1740  BNP 59.3 62.0    ProBNP (last 3 results) No results for input(s): PROBNP in the last 8760 hours.  CBG: No results for input(s): GLUCAP in the last 168 hours.     SignedRamiro Harvest MD.  [facility] 08/27/2016, 5:09 PM

## 2017-11-03 IMAGING — CR DG CHEST 1V PORT
1 series · 1 of 1 positions shown · non-contrast
Comparison: 07/09/2016

CLINICAL DATA: Pt c/o SOB for 2+ weeks along with a productive
cough with yellow sputum. Hx HTN, Current smoker-- 1ppd.

EXAM:
PORTABLE CHEST 1 VIEW

[AP]
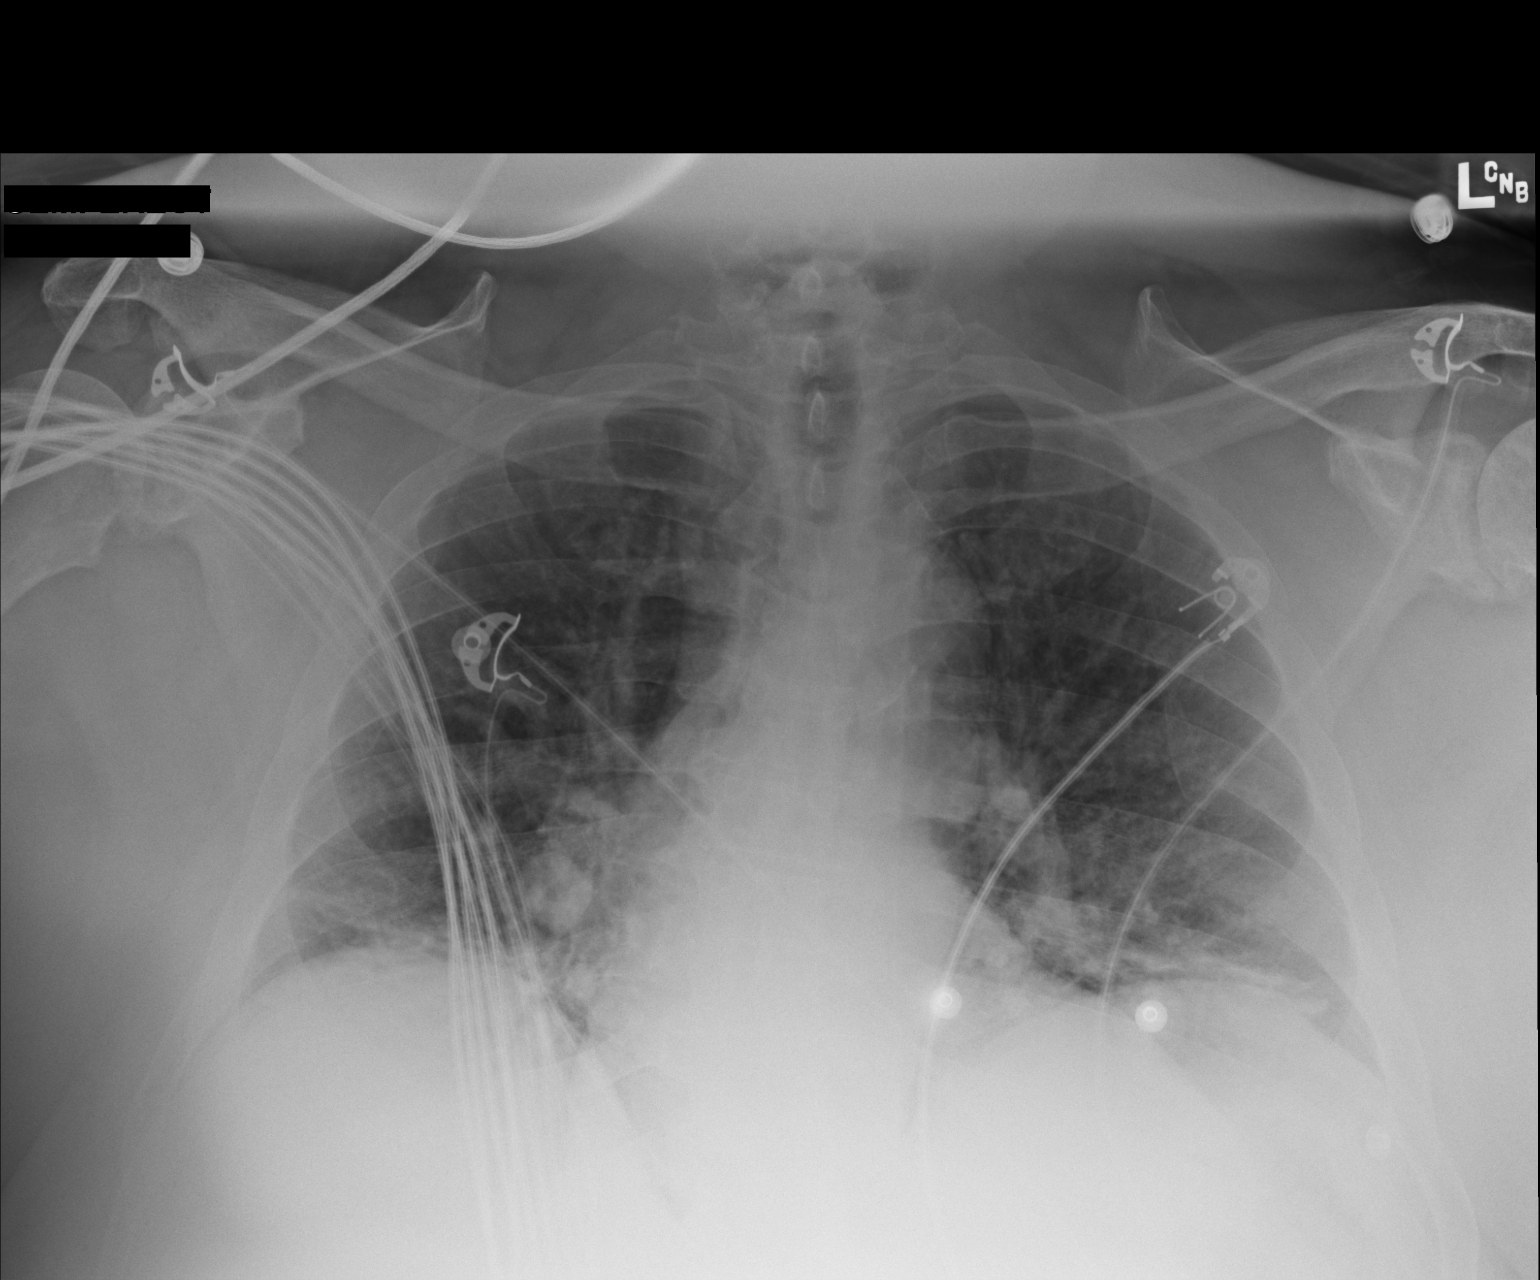

[1 of 1 positions shown; findings below may reference images not displayed]

FINDINGS: Lung volumes are relatively low. There is lung base opacity
consistent with a combination of atelectasis and chronic bronchitic
change. No convincing pneumonia. No pulmonary edema.

No pleural effusion or pneumothorax.

Cardiac silhouette is normal in size. No mediastinal or hilar masses
or evidence of adenopathy.

Bony thorax is grossly intact.
IMPRESSION: 1. No evidence of pneumonia or pulmonary edema.
2. Lung base opacity is similar to the prior exam allowing for lower
lung volumes. This is likely combination of atelectasis and chronic
bronchitic change.

## 2017-11-13 IMAGING — CR DG CHEST 2V
2 series · 2 of 2 positions shown · non-contrast
Comparison: 07/28/2016 chest radiograph

CLINICAL DATA: Dyspnea for 1 day, rule out CHF

EXAM:
CHEST  2 VIEW

[chest pa]
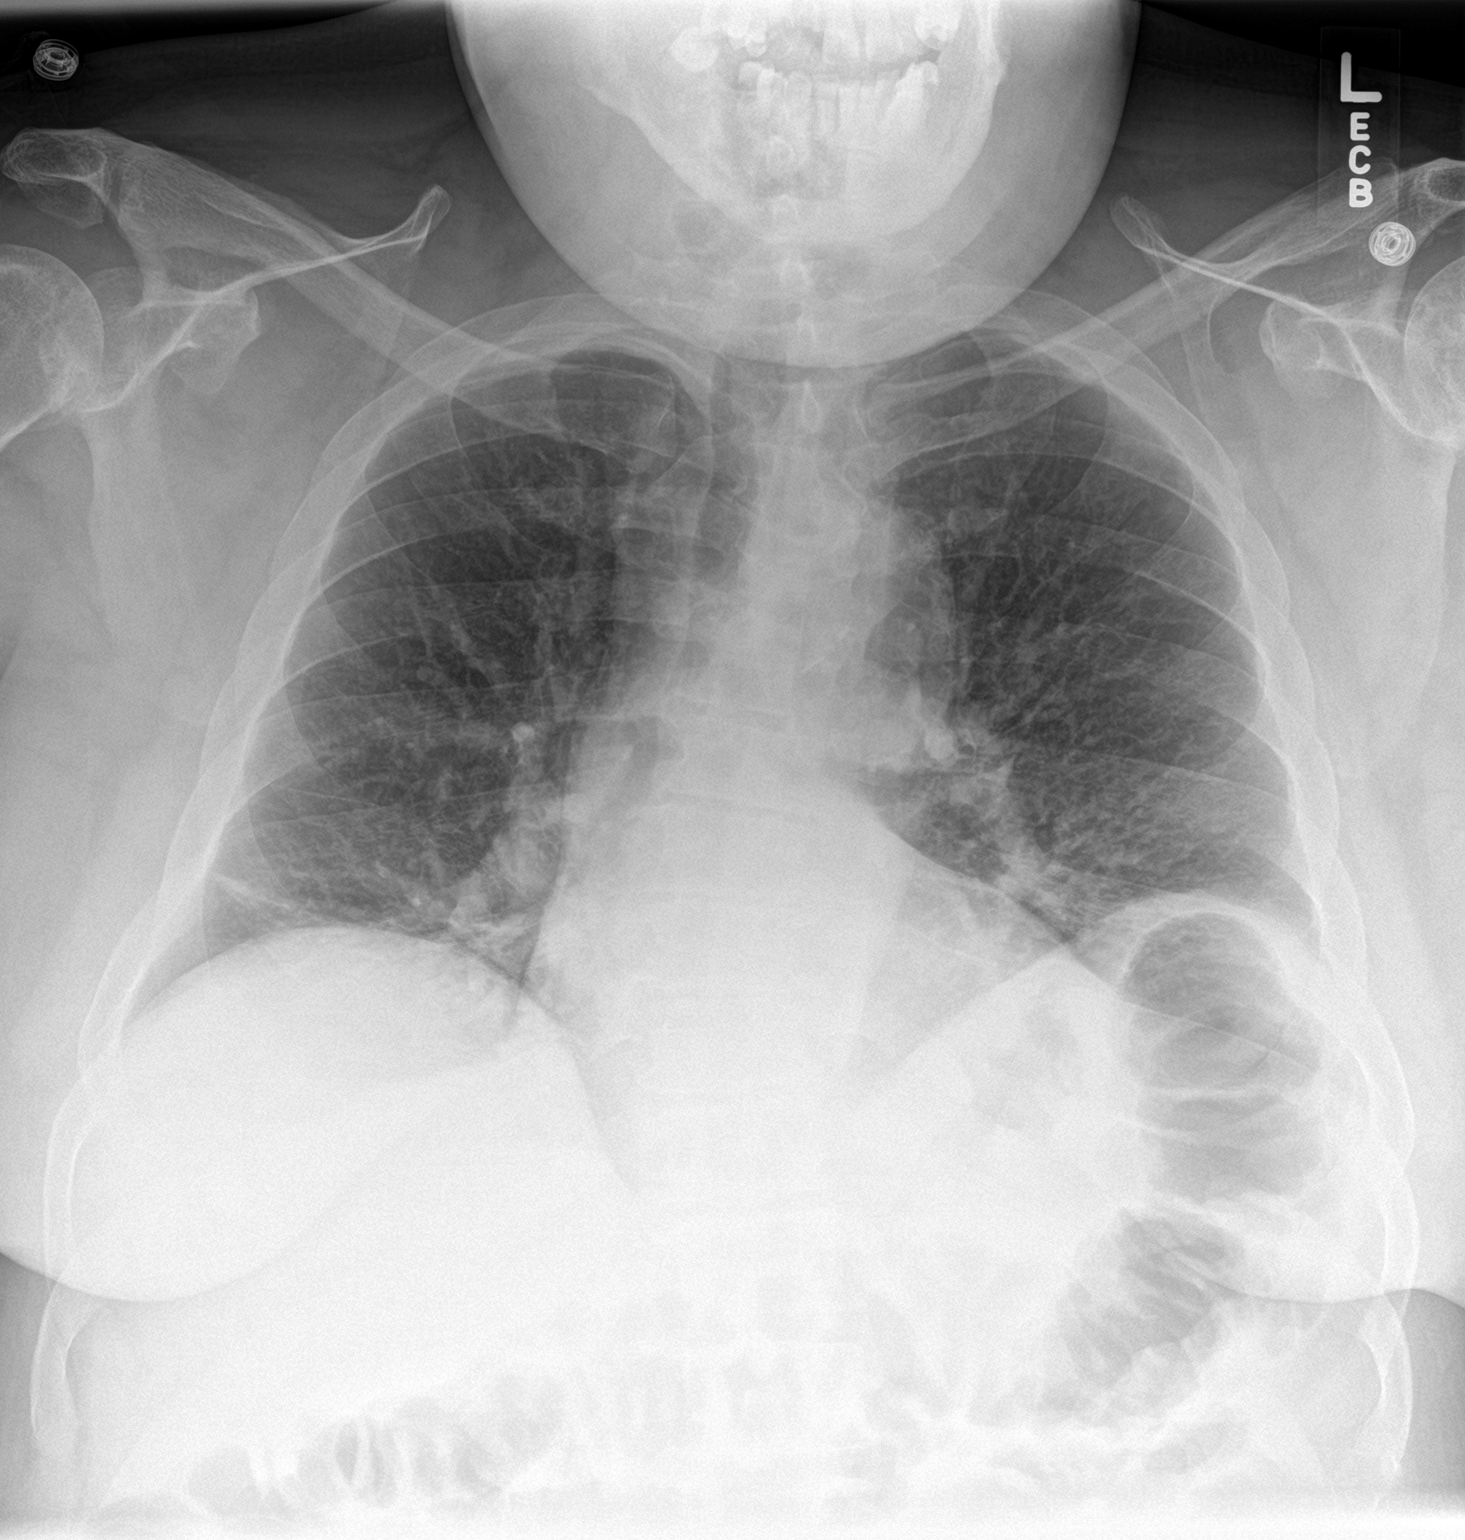

[chest lat]
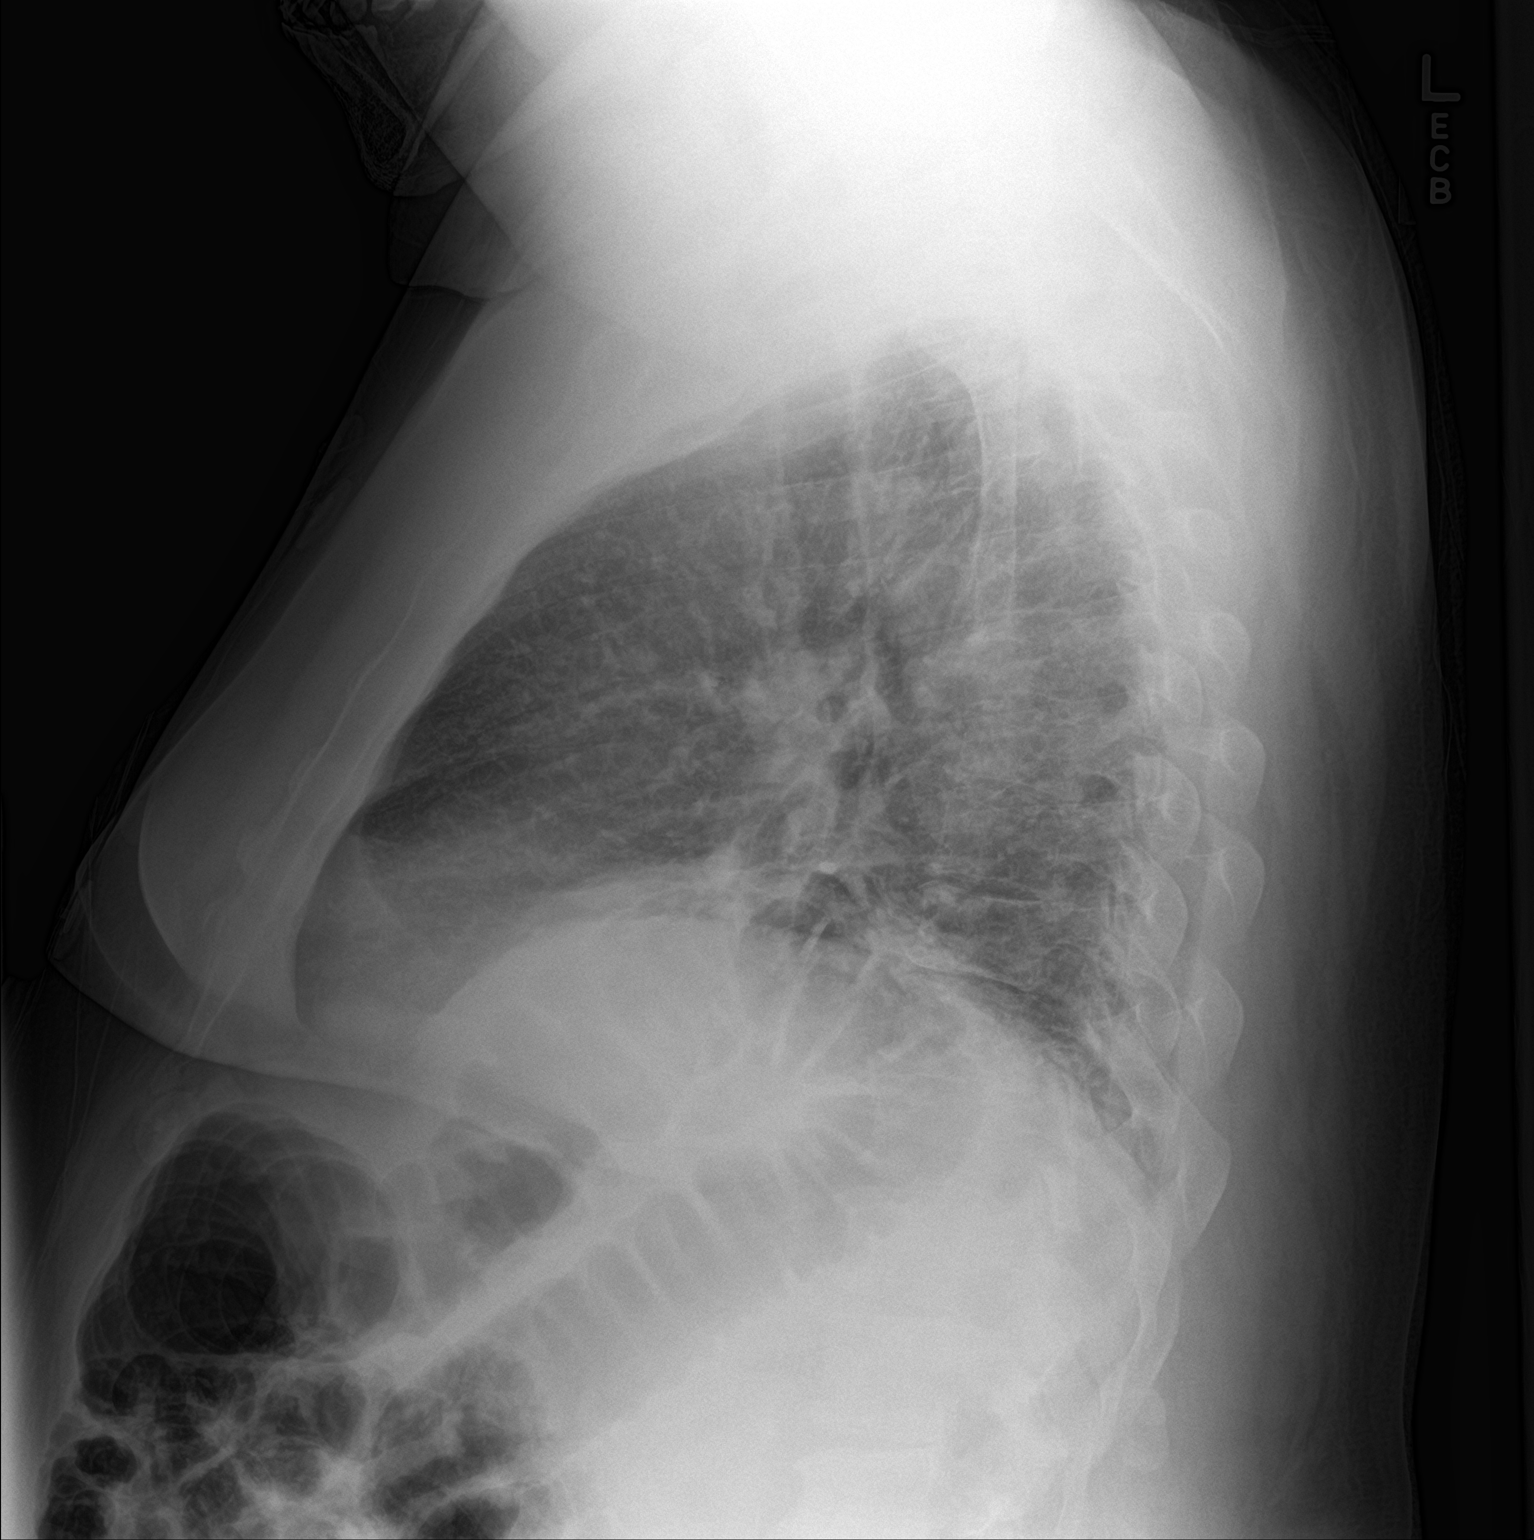

[2 of 2 positions shown; findings below may reference images not displayed]

FINDINGS: The heart is borderline enlarged. The thoracic aorta with slight
uncoiling. No aneurysm. Right basilar atelectasis and/or scarring.
Mild interstitial edema. No pneumonic consolidation. No effusion.
IMPRESSION: Mild interstitial pulmonary edema.

## 2019-02-22 LAB — BASIC METABOLIC PANEL
Anion Gap: 14 — ABNORMAL HIGH (ref 4–12)
BUN: 33 mg/dL — ABNORMAL HIGH (ref 7–20)
CO2: 28 mEq/L (ref 21–32)
Calcium: 9.5 mg/dL (ref 8.8–10.5)
Chloride: 96 mEq/L — ABNORMAL LOW (ref 101–111)
Creatinine Clearance: 28 — ABNORMAL LOW
Creatinine: 2.76 mg/dL — ABNORMAL HIGH (ref 0.60–1.30)
Glucose: 72 mg/dL (ref 70–110)
Potassium: 4.8 mEq/L (ref 3.6–5.0)
Sodium: 138 mEq/L (ref 135–145)

## 2019-02-22 LAB — DIFFERENTIAL
Absolute Baso #: 0 /mm3 (ref 0–200)
Absolute Eos #: 0 /mm3 (ref 0–500)
Absolute Lymph #: 486 /mm3 — ABNORMAL LOW (ref 1000–4800)
Absolute Mono #: 378 /mm3 (ref 0–800)
Absolute Neut #: 4536 /mm3 (ref 1800–7700)
Lymphocytes: 9 % — ABNORMAL LOW (ref 15–45)
Monocytes: 7 % (ref 2–10)
Neutrophils Segmented: 84 % — ABNORMAL HIGH (ref 40–70)

## 2019-02-22 LAB — ETHANOL: Ethanol Lvl: <0.01 gm/dL

## 2019-02-22 LAB — CBC
Hematocrit: 31.3 % — ABNORMAL LOW (ref 40.0–49.0)
Hemoglobin: 10.3 gm/dL — ABNORMAL LOW (ref 13.5–16.5)
MCH: 31.2 PG (ref 27.5–33.0)
MCHC: 33 gm/dL (ref 33.0–36.0)
MCV: 94.7 uL (ref 80–97)
Platelets: 147 10*3/uL — ABNORMAL LOW (ref 150–400)
RBC: 3.3 10*6/uL — ABNORMAL LOW (ref 4.50–6.00)
RDW: 13.1 % (ref 12.0–16.0)
WBC: 5.4 10*3/uL (ref 4.4–10.5)

## 2019-02-22 LAB — UA W/REFLEX CULTURE
Bilirubin Urine: NEGATIVE
Glucose, Ur: NEGATIVE
LEUKOCYTES, UA: NEGATIVE
Nitrite, Urine: NEGATIVE
Protein, Urine: 100 — ABNORMAL HIGH
Specific Gravity, Urine: 1.014 (ref 1.000–1.03)
Urine Hgb: NEGATIVE
Urobilinogen, Urine: <2 (ref 0.2–1.0)
pH, Urine: 5 (ref 5.0–8.0)

## 2019-02-22 LAB — URINE DRUG SCREEN
Amphetamine: NEGATIVE
Barbiturates: NEGATIVE
Benzodiazepines: NEGATIVE
Cannabinoids: NEGATIVE
Cocaine: NEGATIVE
Opiates: NEGATIVE
Oxycodone: NEGATIVE
Phencyclidine: NEGATIVE

## 2019-02-22 LAB — HEPATIC FUNCTION PANEL
ALT: 7 IU/L — ABNORMAL LOW (ref 10–40)
AST: 21 IU/L (ref 15–41)
Albumin: 3.8 gm/dL (ref 3.5–5.0)
Alkaline Phosphatase: 45 IU/L (ref 41–137)
Bilirubin, Direct: 0.1 mg/dL (ref 0.1–0.2)
Total Bilirubin: 0.4 mg/dL (ref 0.2–1.0)
Total Protein: 7.4 g/dL (ref 6.2–8.0)

## 2019-02-22 LAB — APTT: aPTT: 23.4 s (ref 23.0–38.0)

## 2019-02-22 LAB — SALICYLATE LEVEL: Salicylate Lvl: <1.5 mg/dL — ABNORMAL LOW (ref 15–30)

## 2019-02-22 LAB — ACETAMINOPHEN LEVEL: Acetaminophen Level: 1 ug/mL — ABNORMAL LOW (ref 10.0–20.0)

## 2019-02-22 LAB — TROPONIN: Troponin I: 0.03 ng/mL — ABNORMAL HIGH (ref 0.00–0.02)

## 2019-02-22 LAB — PROTHROMBIN TIME (PT)
INR: 1.04 (ref 0.9–1.2)
Protime: 12 s (ref 9.6–13.3)

## 2019-02-22 LAB — AMMONIA: Ammonia: 34 umol/L (ref 11–35)

## 2019-03-18 ENCOUNTER — Encounter: Payer: MEDICARE | Attending: Nurse Practitioner

## 2019-03-18 DIAGNOSIS — F01518 Vascular dementia, unspecified severity, with other behavioral disturbance: Secondary | ICD-10-CM

## 2019-03-18 DIAGNOSIS — F0151 Vascular dementia with behavioral disturbance: Secondary | ICD-10-CM

## 2019-03-19 DIAGNOSIS — F01518 Vascular dementia, unspecified severity, with other behavioral disturbance: Secondary | ICD-10-CM

## 2019-03-19 DIAGNOSIS — F0151 Vascular dementia with behavioral disturbance: Secondary | ICD-10-CM

## 2019-03-19 NOTE — Progress Notes (Signed)
[facility] Progress Note    NAME: Edwin Fuller  DATE: 03/18/19  ROOM #: 101-2  DOB: Aug 20, 1951  REASON FOR VISIT: Other (routine visit)    CODE STATUS: Full Code    History obtained from chart review, staff, due to patient's confusion.    SUBJECTIVE:  HPI: Edwin Fuller is a 68 y.o. male.  Patient has a PMH significant for dementia, CHF, HTN, CKD, depression and anxiety.    He was admitted to [facility] from 4/19 through 4/22 and treated for ALT MENTAL STATUS/DIARRHEA/RENAL INSUFFICIENCY.  He was admitted to [facility] for PT/OT services.    Hospital course:  Pt is a 68 yr old Male from an nursing home has history of vascular dementia with behavioral disturbances who was admitted on 02/22/19 for ALT MENTAL STATUS/DIARRHEA/RENAL INSUFFICIENCY anemia, depression and anxiety admitted for AMS, diarrhea. Patient has some AKI looking at records from [place] from January, patient was a cardiac arrest at that time and baseline creat appears 1.8. Patient apparently had new onset seizures at that time and was started on Keppra. MRI negative for evidence of acute or subacute ischemic infarct or acute intracranial abnormality. Pt has had no seizure like activity since admission. Pt working with PT/OT and will discharge back to SNF. Requested COVID testing negative. Pt deemed stable for discharge.     Pt seen and examined at bedside today and is noted to be in no acute distress.  Staff provide appropriate updates; appreciate staff input.  I have reviewed patients??? past medical, surgical, social, and family history and have made updates where appropriate. Allergies and Medications were reviewed through the [facility] EMR.    Patient Active Problem List    Diagnosis Date Noted   ??? Vascular dementia with behavioral disturbance (HCC) 03/19/2019   ??? Chronic diastolic heart failure (HCC) 03/19/2019   ??? Chronic kidney disease, stage III (moderate) (HCC) 03/19/2019   ??? Obstructive chronic bronchitis without exacerbation (HCC) 03/19/2019   ???  Anxiety disorder due to known physiological condition 03/19/2019   ??? Major depression, recurrent, chronic (HCC) 03/19/2019   ??? Essential hypertension, malignant 03/19/2019   ??? Gastroesophageal reflux disease without esophagitis 03/19/2019   ??? Chronic pain syndrome 03/19/2019   ??? Anemia, unspecified 03/19/2019       REVIEW OF SYSTEMS  Positive responses are highlighted in bold  Constitutional:  Fever, Chills, Night Sweats, Fatigue, Unexpected changes in weight  HENT:  Ear pain, Tinnitus, Nosebleeds, Trouble swallowing, Hearing loss, Sore throat  Cardiovascular:  Chest Pain, Palpitations, Orthopnea, Paroxysmal Nocturnal Dyspnea  Respiratory:  Cough, Wheezing, Shortness of breath, Chest tightness, Apnea  Gastrointestinal:  Nausea, Vomiting, Diarrhea, Constipation, Heartburn, Blood in stool  Genitourinary:  Difficulty or painful urination, Flank pain, Change in frequency, Urgency  Skin:  Color change, Rash, Itching, Wound  Musculoskeletal:  Joint pain, Back pain, Gait problems, Joint swelling, Myalgias  Neurological:  Dizziness, Headaches, Presyncope, Numbness, Seizures, Tremors  Endocrine:  Heat Intolerance, Cold Intolerance, Polydipsia, Polyphagia, Polyuria    OBJECTIVE:  PHYSICAL EXAM:  VS:  118/74, 98.2, 70, 16 96% on room air  Weight in pounds:  224.0  Pain: Denies    VS reviewed.  General Appearance: chronically ill and debilitated, in no acute distress  Head: normocephalic and atraumatic  Eyes: conjunctivae and eye lids without erythema  ENT: external ear and ear canal normal bilaterally, nose without deformity, nasal mucosa and turbinates normal without polyps, oropharynx normal, dentition is normal for age, no lip or gum lesions noted  Neck: supple and  non-tender without mass, no thyromegaly or thyroid nodules, no cervical lymphadenopathy  Pulmonary/Chest: clear but diminished to auscultation bilaterally- no wheezes, rales or rhonchi, normal air movement, no respiratory distress or retractions, requires no  supplemental oxygen  Cardiovascular: normal rate, regular rhythm, normal S1 and S2, no murmurs, rubs, clicks, or gallops, distal pulses intact  Abdomen: soft, non-tender, non-distended, bowel sounds physiologic,  no rebound or guarding, no masses or hernias noted. Liver and spleen without enlargement.   Extremities: no cyanosis, clubbing or edema of the lower extremities  Musculoskeletal: No joint swelling or gross deformity   Neuro:  Alert, 4/5 strength globally and symmetrically, normal speech, no focal findings or movement disorder noted  Psych:  Normal affect without evidence of depression or anxiety, insight and judgement are impaired, memory appears impaired  Skin: warm and dry, no rash or erythema    LABS:  02/24/19:  Sodium 137   Potassium 4.1   Chloride 101   Carbon Dioxide 26   Anion Gap 10   BUN 19   Creatinine 1.77   GFR Calculation 47  Random Glucose 71   Calcium 9.40  Ammonia 34     ASSESSMENT & PLAN:    1. Vascular dementia with behavioral disturbance [facility](HCC)  Staff report no new or increased behaviors.    2. Chronic diastolic heart failure (HCC)  Asking for weekly weights and VS monitoring.  Continue lasix and potassium.    3. Chronic kidney disease, stage III (moderate) (HCC)  Labs stable.    4. Obstructive chronic bronchitis without exacerbation (HCC)  Continues to do well with Symbicort, atrovent.    5. Anxiety disorder due to known physiological condition  PRN hydroxyzine.    6. Major depression, recurrent, chronic (HCC)  Staff report no SI/HI.  Continue Trazodone, Melatonin, Duloxetine.  Antipsychotic/Antianiety/Hypnotic/Psychotropic/Sedation/Antidepressant medications are continued at this time because discontinuation may result in adverse effects of return or concerning behaviors/symptoms.    7. Essential hypertension, malignant  BP at goal with Isosorbide, Hydralazine, Furosemide, Carvedilol.  Continue ASA 81 mg and statin.  No chest pain/pressure.    8. Chronic pain syndrome  Methadone.    9.   GERD without esophagitis  Protonix.    10.  Anemia, unspecified.  Continue Folic Acid.  Routine lab monitoring.    Disposition:  Assessment and plan as stated above.  Follow-up per routine and as warranted.    Plan of care reviewed by Dr. Harlon DittyGregory Parrento, MD.  Electronically signed by Scherrie BatemanBRENDA K [facility], APRN - NP on 03/19/2019 at 2:41 PM

## 2019-04-08 ENCOUNTER — Encounter: Payer: MEDICARE | Attending: Nurse Practitioner

## 2019-04-08 DIAGNOSIS — F01518 Vascular dementia, unspecified severity, with other behavioral disturbance: Secondary | ICD-10-CM

## 2019-04-08 DIAGNOSIS — F0151 Vascular dementia with behavioral disturbance: Secondary | ICD-10-CM

## 2019-04-09 NOTE — Progress Notes (Signed)
[facility] Progress Note    NAME: Edwin GardenerMilton Fuller  DATE: 04/08/19  ROOM #: 101-2  DOB: [birth year]  REASON FOR VISIT: Other (acute visit for poor appetite and weight loss)    CODE STATUS: Full Code    History obtained from chart review, staff, due to patient's confusion.    SUBJECTIVE:  HPI: Edwin GardenerMilton Fuller is a 68 y.o. male.  Patient has a PMH significant for dementia, CHF, HTN, CKD, depression and anxiety.    [facility] and treated for ALT MENTAL STATUS/DIARRHEA/RENAL INSUFFICIENCY.  [facility]    Hospital course:  Pt is a 68 yr old Male from an nursing home has history of vascular dementia with behavioral disturbances who was admitted on 02/22/19 for ALT MENTAL STATUS/DIARRHEA/RENAL INSUFFICIENCY anemia, depression and anxiety admitted for AMS, diarrhea. Patient has some AKI looking at records from [place] from January, patient was a cardiac arrest at that time and baseline creat appears 1.8. Patient apparently had new onset seizures at that time and was started on Keppra. MRI negative for evidence of acute or subacute ischemic infarct or acute intracranial abnormality. Pt has had no seizure like activity since admission. Pt working with PT/OT and will discharge back to SNF. Requested COVID testing negative. Pt deemed stable for discharge.     Pt seen and examined at bedside today and is noted to be in no acute distress.  He appears fatigued and engages in minimal conversation.  Staff provide appropriate updates; appreciate staff input.  I have reviewed patients??? past medical, surgical, social, and family history and have made updates where appropriate. Allergies and Medications [facility].    Patient Active Problem List    Diagnosis Date Noted   ??? Weight loss 04/09/2019   ??? Vascular dementia with behavioral disturbance (HCC) 03/19/2019   ??? Chronic diastolic heart failure (HCC) 03/19/2019   ??? Chronic kidney disease,  stage III (moderate) (HCC) 03/19/2019   ??? Obstructive chronic bronchitis without exacerbation (HCC) 03/19/2019   ??? Anxiety disorder due to known physiological condition 03/19/2019   ??? Major depression, recurrent, chronic (HCC) 03/19/2019   ??? Essential hypertension, malignant 03/19/2019   ??? Gastroesophageal reflux disease without esophagitis 03/19/2019   ??? Chronic pain syndrome 03/19/2019   ??? Anemia, unspecified 03/19/2019       REVIEW OF SYSTEMS  Positive responses are highlighted in bold  Constitutional:  Fever, Chills, Night Sweats, Fatigue, Unexpected changes in weight  HENT:  Ear pain, Tinnitus, Nosebleeds, Trouble swallowing, Hearing loss, Sore throat  Cardiovascular:  Chest Pain, Palpitations, Orthopnea, Paroxysmal Nocturnal Dyspnea  Respiratory:  Cough, Wheezing, Shortness of breath, Chest tightness, Apnea  Gastrointestinal:  Nausea, Vomiting, Diarrhea, Constipation, Heartburn, Blood in stool  Genitourinary:  Difficulty or painful urination, Flank pain, Change in frequency, Urgency  Skin:  Color change, Rash, Itching, Wound  Musculoskeletal:  Joint pain, Back pain, Gait problems, Joint swelling, Myalgias  Neurological:  Dizziness, Headaches, Presyncope, Numbness, Seizures, Tremors  Endocrine:  Heat Intolerance, Cold Intolerance, Polydipsia, Polyphagia, Polyuria    OBJECTIVE:  PHYSICAL EXAM:  VS:  118/74, 98.2, 70, 16 96% on room air  Weight in pounds:  200.0 (was 224.0)  Pain: Denies    VS reviewed.  General Appearance: chronically ill and debilitated, appears fatigued.  Head: normocephalic and atraumatic  Eyes: conjunctivae and eye lids without erythema  ENT: external ear and ear canal normal bilaterally, nose without deformity, nasal mucosa and  turbinates normal without polyps, oropharynx normal, dentition is normal for age, no lip or gum lesions noted  Neck: supple and non-tender without mass, no thyromegaly or thyroid nodules, no cervical lymphadenopathy  Pulmonary/Chest: clear but diminished to  auscultation bilaterally- no wheezes, rales or rhonchi, normal air movement, no respiratory distress or retractions, requires no supplemental oxygen  Cardiovascular: normal rate, regular rhythm, normal S1 and S2, no murmurs, rubs, clicks, or gallops, distal pulses intact  Abdomen: soft, non-tender, non-distended, bowel sounds physiologic,  no rebound or guarding, no masses or hernias noted. Liver and spleen without enlargement.   Extremities: no cyanosis, clubbing or edema of the lower extremities  Musculoskeletal: No joint swelling or gross deformity   Neuro:  Alert, 3/5 strength globally and symmetrically, minimal speech, no focal findings or movement disorder noted  Psych:  Blunt affect without evidence of depression or anxiety, insight and judgement are impaired, memory appears impaired  Skin: warm and dry, no rash or erythema    ASSESSMENT & PLAN:    1. Vascular dementia with behavioral disturbance [facility])  Staff report no new or increased behaviors.    2. Chronic diastolic heart failure (HCC)  Asking for weekly weights and VS monitoring.  Continue lasix and potassium.    3. Chronic kidney disease, stage III (moderate) (HCC)  Labs stable.    4. Obstructive chronic bronchitis without exacerbation (HCC)  Continues to do well with Symbicort, atrovent.    5. Anxiety disorder due to known physiological condition  PRN hydroxyzine.    6. Major depression, recurrent, chronic (HCC)  Staff report no SI/HI.  Continue Trazodone, Melatonin, Duloxetine.  Antipsychotic/Antianiety/Hypnotic/Psychotropic/Sedation/Antidepressant medications are continued at this time because discontinuation may result in adverse effects of return or concerning behaviors/symptoms.    7. Essential hypertension, malignant  BP at goal with Isosorbide, Hydralazine, Furosemide, Carvedilol.  Continue ASA 81 mg and statin.  No chest pain/pressure.    8. Chronic pain syndrome  Methadone.    9.  GERD without esophagitis  Protonix.    10.  Anemia,  unspecified.  Continue Folic Acid.  Routine lab monitoring.    11.  Seizure disorder  Continue Keppra.  Asking for Keppra level.    12.  Chronic pain  Continues Methadone 60 mg po once daily.  Discussed with physician.  Will decrease dosage to 40 mg po once daily.  Asking for BMP tomorrow morning.  Asking staff to continue to monitor.    13.  Weight loss  Asking for weekly weights and weekly vital signs.  Asking for Dietary to eval and treat.    Disposition:  Assessment and plan as stated above.  Follow-up per routine and as warranted.    Plan of care reviewed by Dr. Harlon Ditty, MD.  Electronically signed by Scherrie Bateman [facility], APRN - NP on 04/09/2019 at 8:33 PM
